# Patient Record
Sex: Female | Born: 1960 | Race: White | Hispanic: No | Marital: Married | Smoking: Never smoker
Health system: Southern US, Community
[De-identification: ages and names within clinical notes are randomized; demographics above are authoritative.]

## PROBLEM LIST (undated history)

## (undated) DIAGNOSIS — K219 Gastro-esophageal reflux disease without esophagitis: Secondary | ICD-10-CM

## (undated) DIAGNOSIS — E079 Disorder of thyroid, unspecified: Secondary | ICD-10-CM

## (undated) HISTORY — DX: Disorder of thyroid, unspecified: E07.9

## (undated) HISTORY — DX: Gastro-esophageal reflux disease without esophagitis: K21.9

## (undated) HISTORY — PX: TONSILLECTOMY: SUR1361

---

## 2007-02-26 ENCOUNTER — Other Ambulatory Visit: Admission: RE | Admit: 2007-02-26 | Discharge: 2007-02-26 | Payer: Self-pay | Admitting: Obstetrics and Gynecology

## 2007-06-22 ENCOUNTER — Encounter: Admission: RE | Admit: 2007-06-22 | Discharge: 2007-06-22 | Payer: Self-pay | Admitting: Obstetrics and Gynecology

## 2007-12-01 ENCOUNTER — Encounter: Admission: RE | Admit: 2007-12-01 | Discharge: 2007-12-01 | Payer: Self-pay | Admitting: Obstetrics and Gynecology

## 2009-02-13 ENCOUNTER — Encounter: Admission: RE | Admit: 2009-02-13 | Discharge: 2009-02-13 | Payer: Self-pay | Admitting: Otolaryngology

## 2009-03-10 ENCOUNTER — Other Ambulatory Visit: Admission: RE | Admit: 2009-03-10 | Discharge: 2009-03-10 | Payer: Self-pay | Admitting: Obstetrics and Gynecology

## 2010-08-19 ENCOUNTER — Encounter: Payer: Self-pay | Admitting: Endocrinology

## 2010-08-19 ENCOUNTER — Encounter: Payer: Self-pay | Admitting: Obstetrics and Gynecology

## 2011-07-17 ENCOUNTER — Other Ambulatory Visit: Payer: Self-pay | Admitting: Family Medicine

## 2011-07-17 DIAGNOSIS — Z1231 Encounter for screening mammogram for malignant neoplasm of breast: Secondary | ICD-10-CM

## 2011-08-21 ENCOUNTER — Ambulatory Visit
Admission: RE | Admit: 2011-08-21 | Discharge: 2011-08-21 | Disposition: A | Discharge status: Home / Self Care | Payer: BC Managed Care – PPO | Source: Ambulatory Visit | Attending: Family Medicine | Admitting: Family Medicine

## 2011-08-21 DIAGNOSIS — Z1231 Encounter for screening mammogram for malignant neoplasm of breast: Secondary | ICD-10-CM

## 2012-07-20 ENCOUNTER — Other Ambulatory Visit: Payer: Self-pay | Admitting: Otolaryngology

## 2012-07-20 DIAGNOSIS — E049 Nontoxic goiter, unspecified: Secondary | ICD-10-CM

## 2013-11-10 ENCOUNTER — Other Ambulatory Visit: Payer: Self-pay

## 2013-11-10 DIAGNOSIS — Z1231 Encounter for screening mammogram for malignant neoplasm of breast: Secondary | ICD-10-CM

## 2013-11-29 ENCOUNTER — Ambulatory Visit: Payer: BC Managed Care – PPO

## 2013-11-30 ENCOUNTER — Ambulatory Visit: Payer: BC Managed Care – PPO

## 2014-04-11 ENCOUNTER — Ambulatory Visit: Payer: BC Managed Care – PPO

## 2014-04-13 ENCOUNTER — Ambulatory Visit
Admission: RE | Admit: 2014-04-13 | Discharge: 2014-04-13 | Disposition: A | Discharge status: Home / Self Care | Payer: BC Managed Care – PPO | Source: Ambulatory Visit

## 2014-04-13 DIAGNOSIS — Z1231 Encounter for screening mammogram for malignant neoplasm of breast: Secondary | ICD-10-CM

## 2016-03-18 DIAGNOSIS — Z1211 Encounter for screening for malignant neoplasm of colon: Secondary | ICD-10-CM | POA: Diagnosis not present

## 2016-08-07 DIAGNOSIS — Z6826 Body mass index (BMI) 26.0-26.9, adult: Secondary | ICD-10-CM | POA: Diagnosis not present

## 2016-08-07 DIAGNOSIS — Z01419 Encounter for gynecological examination (general) (routine) without abnormal findings: Secondary | ICD-10-CM | POA: Diagnosis not present

## 2016-12-02 DIAGNOSIS — E039 Hypothyroidism, unspecified: Secondary | ICD-10-CM | POA: Diagnosis not present

## 2016-12-02 DIAGNOSIS — S80861A Insect bite (nonvenomous), right lower leg, initial encounter: Secondary | ICD-10-CM | POA: Diagnosis not present

## 2016-12-02 DIAGNOSIS — E7800 Pure hypercholesterolemia, unspecified: Secondary | ICD-10-CM | POA: Diagnosis not present

## 2016-12-02 DIAGNOSIS — Z0000 Encounter for general adult medical examination without abnormal findings: Secondary | ICD-10-CM | POA: Diagnosis not present

## 2016-12-02 DIAGNOSIS — E559 Vitamin D deficiency, unspecified: Secondary | ICD-10-CM | POA: Diagnosis not present

## 2016-12-02 DIAGNOSIS — W57XXXA Bitten or stung by nonvenomous insect and other nonvenomous arthropods, initial encounter: Secondary | ICD-10-CM | POA: Diagnosis not present

## 2016-12-04 ENCOUNTER — Other Ambulatory Visit: Payer: Self-pay | Admitting: Family Medicine

## 2016-12-04 DIAGNOSIS — Z1231 Encounter for screening mammogram for malignant neoplasm of breast: Secondary | ICD-10-CM

## 2016-12-24 ENCOUNTER — Ambulatory Visit
Admission: RE | Admit: 2016-12-24 | Discharge: 2016-12-24 | Disposition: A | Discharge status: Home / Self Care | Payer: Self-pay | Source: Ambulatory Visit | Attending: Family Medicine | Admitting: Family Medicine

## 2016-12-24 DIAGNOSIS — Z1231 Encounter for screening mammogram for malignant neoplasm of breast: Secondary | ICD-10-CM

## 2017-10-14 DIAGNOSIS — D225 Melanocytic nevi of trunk: Secondary | ICD-10-CM | POA: Diagnosis not present

## 2017-10-14 DIAGNOSIS — D2261 Melanocytic nevi of right upper limb, including shoulder: Secondary | ICD-10-CM | POA: Diagnosis not present

## 2017-10-14 DIAGNOSIS — D2239 Melanocytic nevi of other parts of face: Secondary | ICD-10-CM | POA: Diagnosis not present

## 2017-10-14 DIAGNOSIS — D2262 Melanocytic nevi of left upper limb, including shoulder: Secondary | ICD-10-CM | POA: Diagnosis not present

## 2018-01-14 DIAGNOSIS — E559 Vitamin D deficiency, unspecified: Secondary | ICD-10-CM | POA: Diagnosis not present

## 2018-01-14 DIAGNOSIS — E039 Hypothyroidism, unspecified: Secondary | ICD-10-CM | POA: Diagnosis not present

## 2018-01-14 DIAGNOSIS — Z0000 Encounter for general adult medical examination without abnormal findings: Secondary | ICD-10-CM | POA: Diagnosis not present

## 2018-01-14 DIAGNOSIS — E7800 Pure hypercholesterolemia, unspecified: Secondary | ICD-10-CM | POA: Diagnosis not present

## 2019-03-28 IMAGING — MG DIGITAL SCREENING BILATERAL MAMMOGRAM WITH CAD
4 series · 4 of 4 positions shown · non-contrast
Comparison: Previous exam(s).

CLINICAL DATA: Screening.

EXAM:
DIGITAL SCREENING BILATERAL MAMMOGRAM WITH CAD

[L MLO]
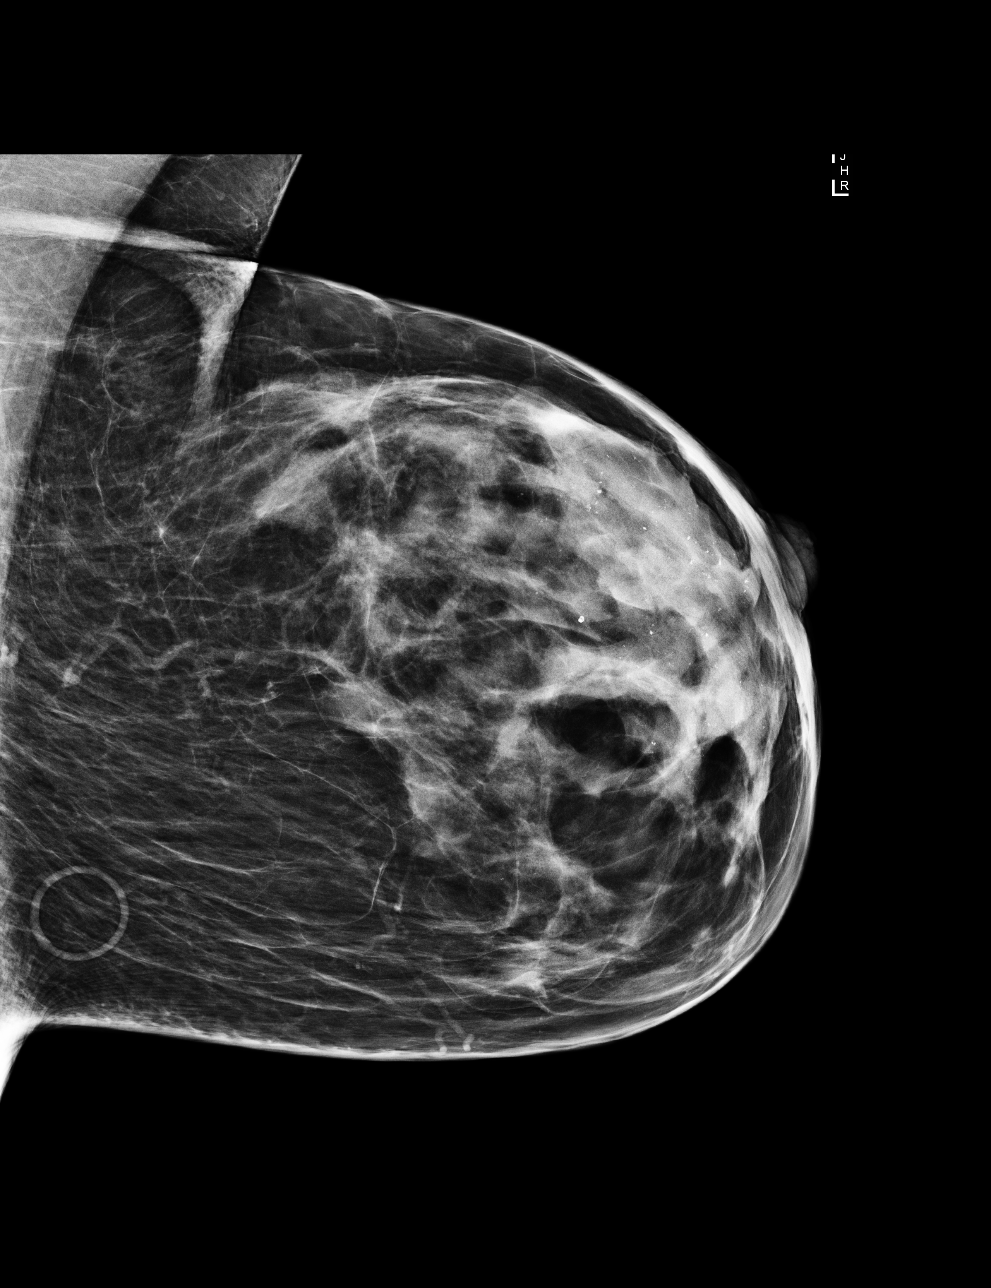

[R CC]
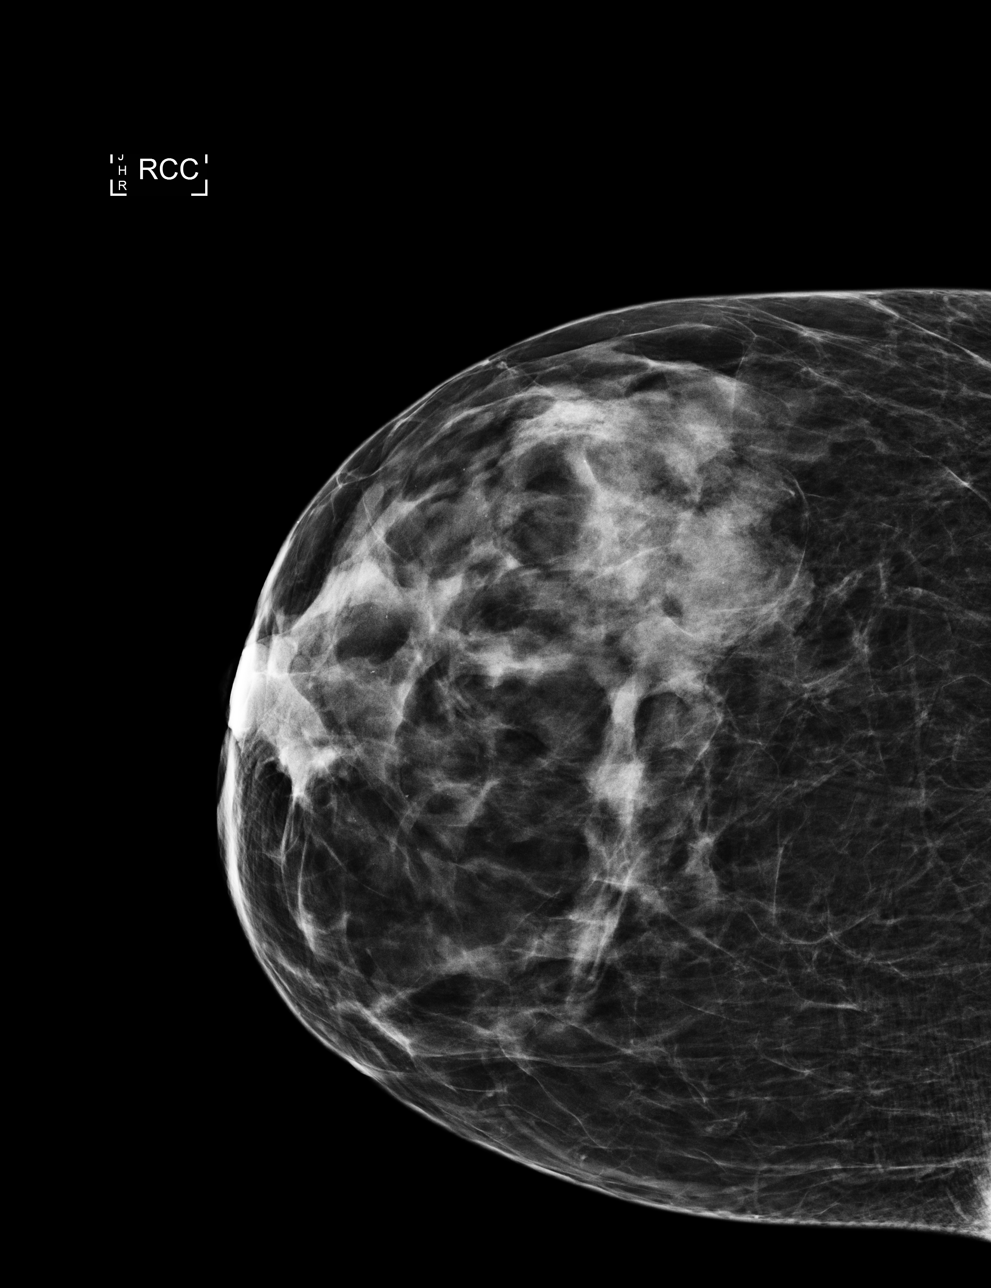

[R MLO]
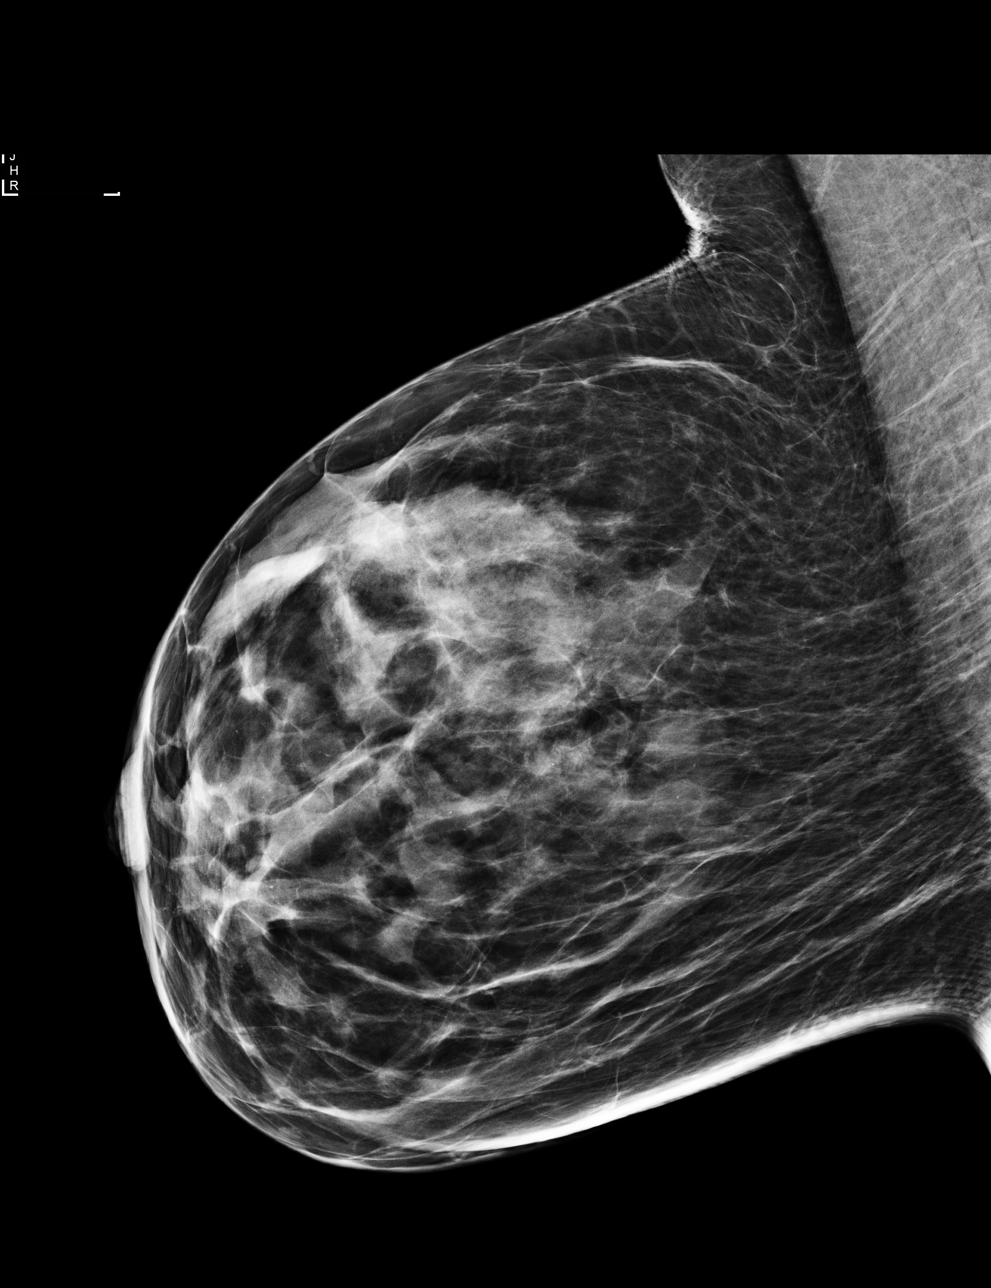

[L CC]
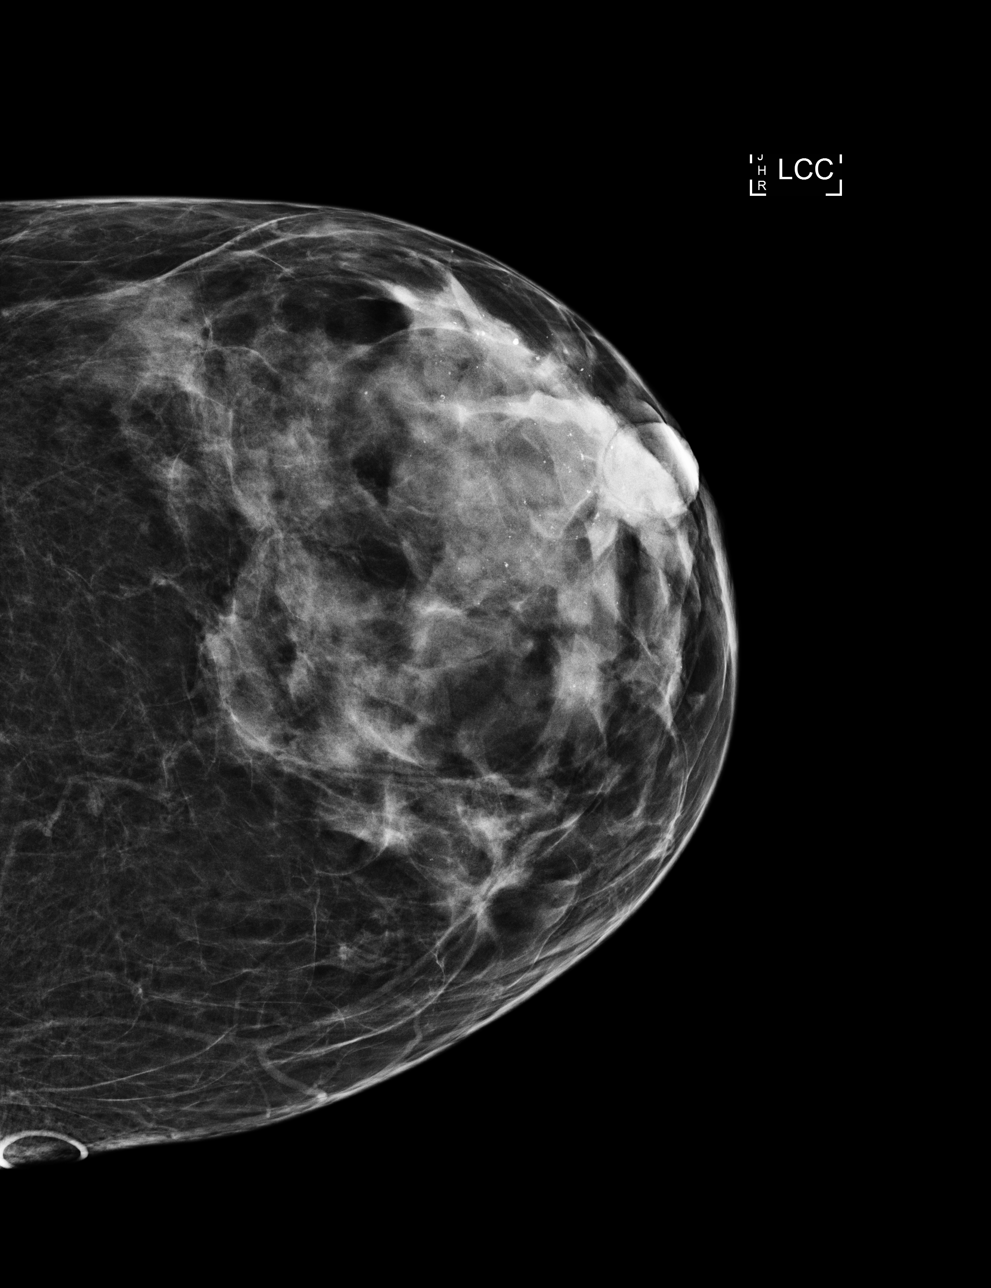

[4 of 4 positions shown; findings below may reference images not displayed]

ACR Breast Density Category c: The breast tissue is heterogeneously
dense, which may obscure small masses.
FINDINGS: There are no findings suspicious for malignancy. Images were
processed with CAD.
IMPRESSION: No mammographic evidence of malignancy. A result letter of this
screening mammogram will be mailed directly to the patient.

RECOMMENDATION:
Screening mammogram in one year. (Code:[id number])

BI-RADS CATEGORY  1: Negative.

## 2019-05-04 DIAGNOSIS — E039 Hypothyroidism, unspecified: Secondary | ICD-10-CM | POA: Diagnosis not present

## 2019-05-04 DIAGNOSIS — E7800 Pure hypercholesterolemia, unspecified: Secondary | ICD-10-CM | POA: Diagnosis not present

## 2019-05-04 DIAGNOSIS — E559 Vitamin D deficiency, unspecified: Secondary | ICD-10-CM | POA: Diagnosis not present

## 2019-05-04 DIAGNOSIS — Z23 Encounter for immunization: Secondary | ICD-10-CM | POA: Diagnosis not present

## 2019-05-04 DIAGNOSIS — Z0000 Encounter for general adult medical examination without abnormal findings: Secondary | ICD-10-CM | POA: Diagnosis not present

## 2019-10-18 ENCOUNTER — Ambulatory Visit: Payer: BC Managed Care – PPO | Attending: Internal Medicine

## 2019-10-18 ENCOUNTER — Ambulatory Visit: Payer: BLUE CROSS/BLUE SHIELD

## 2019-10-18 DIAGNOSIS — Z23 Encounter for immunization: Secondary | ICD-10-CM

## 2019-10-18 NOTE — Progress Notes (Signed)
   Covid-19 Vaccination Clinic  Name:  ALESA ECHEVARRIA    MRN: [id number] DOB: 1960/10/29  10/18/2019  Ms. Sivils was observed post Covid-19 immunization for 15 minutes without incident. She was provided with Vaccine Information Sheet and instruction to access the V-Safe system.   Ms. Fanara was instructed to call 911 with any severe reactions post vaccine: Marland Kitchen Difficulty breathing  . Swelling of face and throat  . A fast heartbeat  . A bad rash all over body  . Dizziness and weakness   Immunizations Administered    Name Date Dose VIS Date Route   Pfizer COVID-19 Vaccine 10/18/2019 11:20 AM 0.3 mL 07/09/2019 Intramuscular   Manufacturer: [employer], [employer]   Lot: [id number]   NDC: [address]-8

## 2019-11-10 ENCOUNTER — Ambulatory Visit: Payer: BC Managed Care – PPO

## 2019-12-07 ENCOUNTER — Other Ambulatory Visit: Payer: Self-pay | Admitting: Family Medicine

## 2019-12-07 DIAGNOSIS — Z1231 Encounter for screening mammogram for malignant neoplasm of breast: Secondary | ICD-10-CM

## 2019-12-25 DIAGNOSIS — R079 Chest pain, unspecified: Secondary | ICD-10-CM | POA: Diagnosis not present

## 2020-01-07 DIAGNOSIS — M353 Polymyalgia rheumatica: Secondary | ICD-10-CM | POA: Diagnosis not present

## 2020-01-07 DIAGNOSIS — M25512 Pain in left shoulder: Secondary | ICD-10-CM | POA: Diagnosis not present

## 2020-01-07 DIAGNOSIS — S40261D Insect bite (nonvenomous) of right shoulder, subsequent encounter: Secondary | ICD-10-CM | POA: Diagnosis not present

## 2020-01-12 DIAGNOSIS — M25512 Pain in left shoulder: Secondary | ICD-10-CM | POA: Diagnosis not present

## 2020-01-12 DIAGNOSIS — M940 Chondrocostal junction syndrome [Tietze]: Secondary | ICD-10-CM | POA: Diagnosis not present

## 2020-01-14 DIAGNOSIS — M791 Myalgia, unspecified site: Secondary | ICD-10-CM | POA: Diagnosis not present

## 2020-01-14 DIAGNOSIS — S20461A Insect bite (nonvenomous) of right back wall of thorax, initial encounter: Secondary | ICD-10-CM | POA: Diagnosis not present

## 2020-01-14 DIAGNOSIS — L03119 Cellulitis of unspecified part of limb: Secondary | ICD-10-CM | POA: Diagnosis not present

## 2020-01-14 DIAGNOSIS — W57XXXA Bitten or stung by nonvenomous insect and other nonvenomous arthropods, initial encounter: Secondary | ICD-10-CM | POA: Diagnosis not present

## 2020-01-20 DIAGNOSIS — D485 Neoplasm of uncertain behavior of skin: Secondary | ICD-10-CM | POA: Diagnosis not present

## 2020-01-20 DIAGNOSIS — B429 Sporotrichosis, unspecified: Secondary | ICD-10-CM | POA: Diagnosis not present

## 2020-01-20 DIAGNOSIS — C44629 Squamous cell carcinoma of skin of left upper limb, including shoulder: Secondary | ICD-10-CM | POA: Diagnosis not present

## 2020-02-01 ENCOUNTER — Ambulatory Visit: Payer: BC Managed Care – PPO

## 2020-02-07 DIAGNOSIS — C44629 Squamous cell carcinoma of skin of left upper limb, including shoulder: Secondary | ICD-10-CM | POA: Diagnosis not present

## 2020-02-22 DIAGNOSIS — E039 Hypothyroidism, unspecified: Secondary | ICD-10-CM | POA: Diagnosis not present

## 2020-02-22 DIAGNOSIS — Z0000 Encounter for general adult medical examination without abnormal findings: Secondary | ICD-10-CM | POA: Diagnosis not present

## 2020-02-22 DIAGNOSIS — E559 Vitamin D deficiency, unspecified: Secondary | ICD-10-CM | POA: Diagnosis not present

## 2020-02-22 DIAGNOSIS — Z13220 Encounter for screening for lipoid disorders: Secondary | ICD-10-CM | POA: Diagnosis not present

## 2020-04-10 DIAGNOSIS — D2271 Melanocytic nevi of right lower limb, including hip: Secondary | ICD-10-CM | POA: Diagnosis not present

## 2020-04-10 DIAGNOSIS — D2272 Melanocytic nevi of left lower limb, including hip: Secondary | ICD-10-CM | POA: Diagnosis not present

## 2020-04-10 DIAGNOSIS — Z85828 Personal history of other malignant neoplasm of skin: Secondary | ICD-10-CM | POA: Diagnosis not present

## 2020-04-10 DIAGNOSIS — D1801 Hemangioma of skin and subcutaneous tissue: Secondary | ICD-10-CM | POA: Diagnosis not present

## 2020-04-11 DIAGNOSIS — Z01419 Encounter for gynecological examination (general) (routine) without abnormal findings: Secondary | ICD-10-CM | POA: Diagnosis not present

## 2020-04-19 DIAGNOSIS — Z1212 Encounter for screening for malignant neoplasm of rectum: Secondary | ICD-10-CM | POA: Diagnosis not present

## 2020-04-19 DIAGNOSIS — Z1211 Encounter for screening for malignant neoplasm of colon: Secondary | ICD-10-CM | POA: Diagnosis not present

## 2020-04-22 LAB — EXTERNAL GENERIC LAB PROCEDURE: COLOGUARD: NEGATIVE

## 2020-05-01 DIAGNOSIS — D225 Melanocytic nevi of trunk: Secondary | ICD-10-CM | POA: Diagnosis not present

## 2020-05-01 DIAGNOSIS — L821 Other seborrheic keratosis: Secondary | ICD-10-CM | POA: Diagnosis not present

## 2020-05-01 DIAGNOSIS — Z85828 Personal history of other malignant neoplasm of skin: Secondary | ICD-10-CM | POA: Diagnosis not present

## 2021-02-19 DIAGNOSIS — E559 Vitamin D deficiency, unspecified: Secondary | ICD-10-CM | POA: Diagnosis not present

## 2021-02-19 DIAGNOSIS — Z0000 Encounter for general adult medical examination without abnormal findings: Secondary | ICD-10-CM | POA: Diagnosis not present

## 2021-02-19 DIAGNOSIS — E039 Hypothyroidism, unspecified: Secondary | ICD-10-CM | POA: Diagnosis not present

## 2021-02-21 DIAGNOSIS — E039 Hypothyroidism, unspecified: Secondary | ICD-10-CM | POA: Diagnosis not present

## 2021-02-21 DIAGNOSIS — Z0000 Encounter for general adult medical examination without abnormal findings: Secondary | ICD-10-CM | POA: Diagnosis not present

## 2021-02-21 DIAGNOSIS — E559 Vitamin D deficiency, unspecified: Secondary | ICD-10-CM | POA: Diagnosis not present

## 2021-02-26 DIAGNOSIS — E039 Hypothyroidism, unspecified: Secondary | ICD-10-CM | POA: Diagnosis not present

## 2021-02-26 DIAGNOSIS — L700 Acne vulgaris: Secondary | ICD-10-CM | POA: Diagnosis not present

## 2021-02-26 DIAGNOSIS — E559 Vitamin D deficiency, unspecified: Secondary | ICD-10-CM | POA: Diagnosis not present

## 2021-02-26 DIAGNOSIS — Z0000 Encounter for general adult medical examination without abnormal findings: Secondary | ICD-10-CM | POA: Diagnosis not present

## 2021-03-26 ENCOUNTER — Other Ambulatory Visit: Payer: Self-pay | Admitting: Internal Medicine

## 2021-03-26 DIAGNOSIS — Z1231 Encounter for screening mammogram for malignant neoplasm of breast: Secondary | ICD-10-CM

## 2021-04-03 DIAGNOSIS — Z1231 Encounter for screening mammogram for malignant neoplasm of breast: Secondary | ICD-10-CM | POA: Diagnosis not present

## 2021-04-09 DIAGNOSIS — D2261 Melanocytic nevi of right upper limb, including shoulder: Secondary | ICD-10-CM | POA: Diagnosis not present

## 2021-04-09 DIAGNOSIS — D3611 Benign neoplasm of peripheral nerves and autonomic nervous system of face, head, and neck: Secondary | ICD-10-CM | POA: Diagnosis not present

## 2021-04-09 DIAGNOSIS — Z85828 Personal history of other malignant neoplasm of skin: Secondary | ICD-10-CM | POA: Diagnosis not present

## 2021-04-09 DIAGNOSIS — D234 Other benign neoplasm of skin of scalp and neck: Secondary | ICD-10-CM | POA: Diagnosis not present

## 2021-04-09 DIAGNOSIS — D2262 Melanocytic nevi of left upper limb, including shoulder: Secondary | ICD-10-CM | POA: Diagnosis not present

## 2021-04-09 DIAGNOSIS — B078 Other viral warts: Secondary | ICD-10-CM | POA: Diagnosis not present

## 2021-04-09 DIAGNOSIS — D225 Melanocytic nevi of trunk: Secondary | ICD-10-CM | POA: Diagnosis not present

## 2021-05-07 DIAGNOSIS — H5203 Hypermetropia, bilateral: Secondary | ICD-10-CM | POA: Diagnosis not present

## 2021-05-07 DIAGNOSIS — H10413 Chronic giant papillary conjunctivitis, bilateral: Secondary | ICD-10-CM | POA: Diagnosis not present

## 2021-05-07 DIAGNOSIS — H524 Presbyopia: Secondary | ICD-10-CM | POA: Diagnosis not present

## 2021-05-07 DIAGNOSIS — H43811 Vitreous degeneration, right eye: Secondary | ICD-10-CM | POA: Diagnosis not present

## 2022-01-07 DIAGNOSIS — J0140 Acute pansinusitis, unspecified: Secondary | ICD-10-CM | POA: Diagnosis not present

## 2022-01-07 DIAGNOSIS — H10021 Other mucopurulent conjunctivitis, right eye: Secondary | ICD-10-CM | POA: Diagnosis not present

## 2022-03-19 DIAGNOSIS — Z0000 Encounter for general adult medical examination without abnormal findings: Secondary | ICD-10-CM | POA: Diagnosis not present

## 2022-03-26 DIAGNOSIS — Z0000 Encounter for general adult medical examination without abnormal findings: Secondary | ICD-10-CM | POA: Diagnosis not present

## 2022-03-26 DIAGNOSIS — E7800 Pure hypercholesterolemia, unspecified: Secondary | ICD-10-CM | POA: Diagnosis not present

## 2022-03-26 DIAGNOSIS — J3489 Other specified disorders of nose and nasal sinuses: Secondary | ICD-10-CM | POA: Diagnosis not present

## 2023-03-11 DIAGNOSIS — L237 Allergic contact dermatitis due to plants, except food: Secondary | ICD-10-CM | POA: Diagnosis not present

## 2023-03-13 DIAGNOSIS — L237 Allergic contact dermatitis due to plants, except food: Secondary | ICD-10-CM | POA: Diagnosis not present

## 2023-03-19 DIAGNOSIS — L237 Allergic contact dermatitis due to plants, except food: Secondary | ICD-10-CM | POA: Diagnosis not present

## 2023-05-28 DIAGNOSIS — Z0000 Encounter for general adult medical examination without abnormal findings: Secondary | ICD-10-CM | POA: Diagnosis not present

## 2023-05-28 DIAGNOSIS — E559 Vitamin D deficiency, unspecified: Secondary | ICD-10-CM | POA: Diagnosis not present

## 2023-05-28 DIAGNOSIS — E039 Hypothyroidism, unspecified: Secondary | ICD-10-CM | POA: Diagnosis not present

## 2023-05-28 DIAGNOSIS — E7800 Pure hypercholesterolemia, unspecified: Secondary | ICD-10-CM | POA: Diagnosis not present

## 2023-08-13 ENCOUNTER — Ambulatory Visit: Payer: BC Managed Care – PPO | Admitting: Family Medicine

## 2023-08-13 DIAGNOSIS — E079 Disorder of thyroid, unspecified: Secondary | ICD-10-CM | POA: Insufficient documentation

## 2023-08-14 ENCOUNTER — Telehealth: Payer: Self-pay | Admitting: Urgent Care

## 2023-08-14 ENCOUNTER — Encounter: Payer: Self-pay | Admitting: Urgent Care

## 2023-08-14 ENCOUNTER — Ambulatory Visit: Payer: No Typology Code available for payment source | Admitting: Urgent Care

## 2023-08-14 VITALS — BP 113/78 | HR 75 | Temp 98.0°F | Ht 68.0 in | Wt 169.8 lb

## 2023-08-14 DIAGNOSIS — R142 Eructation: Secondary | ICD-10-CM

## 2023-08-14 DIAGNOSIS — K219 Gastro-esophageal reflux disease without esophagitis: Secondary | ICD-10-CM | POA: Diagnosis not present

## 2023-08-14 NOTE — Patient Instructions (Signed)
Consider taking over the counter famotidine (for heartburn symptoms) in place of the PPI (nexium, omeprazole)  You can also purchase OTC Florastor which is a probiotic to help.   Return AFTER 08/25/23 if your symptoms persist and we will perform the H pylori breath test. If you perform this test, you must be FASTING for 8+ hours prior, no PPI therapy for 2 weeks prior.  If negative, consider SIBO testing.

## 2023-08-14 NOTE — Telephone Encounter (Signed)
Copied from CRM [id number]. Topic: Clinical - Medication Question >> Aug 14, 2023 11:52 AM Meghan Durham wrote: Reason for CRM: Patient seen today, wants to confirm what was recommended taking - Famotidine 20mg  or 40mg .

## 2023-08-14 NOTE — Progress Notes (Signed)
New Patient Office Visit  Subjective:  Patient ID: Meghan Durham, female    DOB: May 04, 1961  Age: 63 y.o. MRN: [id number]  CC:  Chief Complaint  Patient presents with   Gastroesophageal Reflux    Has had indigestion since she was on several antibiotics which she started in 06/2023   Discussed the use of AI software for clinical note transcription with the patient who gave verbal consent to proceed.   HPI Meghan Durham presents to establish care. Has an acute concern today, completed annual PE in Oct 2024 with previous PCP. She states mammogram and Cologuard were ordered by previous PCP and will complete soon.  History of Present Illness The patient, with a history of good health, presented with a chief complaint of oral discomfort and heartburn-like symptoms. The onset of symptoms was approximately six months ago, following a dental implant and crown placement. The patient reported a persistent tenderness on the roof of the mouth, initially suspected to be related to the dental procedure. However, dental evaluations revealed no abnormalities.  In December, the symptoms worsened, leading the patient to self-medicate with clindamycin, suspecting an infection. The dentist suggested a possible sinus infection and prescribed a Z-Pak. Despite these treatments, the discomfort persisted, and the patient took another course of clindamycin later in the month.  The patient also reported a sore throat and frequent burping, which led to a suspicion of acid reflux. In an attempt to address these symptoms, the patient took a fungal pill and mouthwash for suspected thrush in January. However, the mouthwash was discontinued after two days due to discomfort.  A significant revelation occurred when the patient identified a potential allergic reaction to Sodium Lauryl Sulfate (SLS) in her toothpaste, which seemed to be exacerbating the oral discomfort. Upon discontinuing the use of this toothpaste, the  patient reported significant improvement in symptoms.  Despite this improvement, the patient still reported some residual throat irritation and occasional heartburn-like symptoms, particularly after eating. The patient also experienced a period of early satiety and bloating, which led to a decrease in appetite and food intake. However, these symptoms have been improving recently.  The patient has been managing these symptoms with over-the-counter medications, including Advil and a proton pump inhibitor (PPI), which were discontinued due to perceived worsening of symptoms. The patient has been eating a bland diet to minimize discomfort.  The patient also reported a period of significant stress due to family health issues, which may have contributed to the exacerbation of symptoms. The patient denied any changes in bowel habits or severe persistent diarrhea.  In summary, the patient presented with a complex symptomatology of oral discomfort, heartburn-like symptoms, and gastrointestinal disturbances following a dental procedure and suspected allergic reaction to toothpaste. The patient's self-initiated changes in oral hygiene products and dietary modifications have led to some improvement in symptoms. However, residual symptoms persist, warranting further investigation.   Outpatient Encounter Medications as of 08/14/2023  Medication Sig   betamethasone dipropionate 0.05 % cream    levothyroxine (SYNTHROID) 100 MCG tablet TAKE 1 TABLET BY MOUTH EVERY DAY ON EMPTY STOMACH IN THE MORNING   Vitamin D, Ergocalciferol, (DRISDOL) 1.25 MG (50000 UNIT) CAPS capsule Take by mouth.   fexofenadine (ALLEGRA) 180 MG tablet Take by mouth. (Patient not taking: Reported on 08/14/2023)   No facility-administered encounter medications on file as of 08/14/2023.    No past medical history on file.  No past surgical history on file.  No family history on file.  Social  History   Socioeconomic History   Marital  status: Married    Spouse name: Not on file   Number of children: Not on file   Years of education: Not on file   Highest education level: Not on file  Occupational History   Not on file  Tobacco Use   Smoking status: Never   Smokeless tobacco: Never  Substance and Sexual Activity   Alcohol use: Not on file   Drug use: Not on file   Sexual activity: Not on file  Other Topics Concern   Not on file  Social History Narrative   Not on file   Social Drivers of Health   Financial Resource Strain: Not on file  Food Insecurity: Not on file  Transportation Needs: Not on file  Physical Activity: Not on file  Stress: Not on file  Social Connections: Not on file  Intimate Partner Violence: Not on file    ROS: as noted in HPI  Objective:  BP 113/78   Pulse 75   Temp 98 F (36.7 C)   Ht 5\' 8"  (1.727 m)   Wt 169 lb 12.8 oz (77 kg)   LMP 08/13/2011   SpO2 100%   BMI 25.82 kg/m   Physical Exam Vitals and nursing note reviewed.  Constitutional:      General: She is not in acute distress.    Appearance: Normal appearance. She is normal weight. She is not ill-appearing, toxic-appearing or diaphoretic.  HENT:     Head: Normocephalic and atraumatic.     Mouth/Throat:     Mouth: Mucous membranes are moist.     Pharynx: Oropharynx is clear. No oropharyngeal exudate or posterior oropharyngeal erythema.  Eyes:     General: No scleral icterus.       Right eye: No discharge.        Left eye: No discharge.     Extraocular Movements: Extraocular movements intact.     Pupils: Pupils are equal, round, and reactive to light.  Cardiovascular:     Rate and Rhythm: Normal rate and regular rhythm.     Pulses: Normal pulses.     Heart sounds: No murmur heard. Pulmonary:     Effort: Pulmonary effort is normal. No respiratory distress.     Breath sounds: Normal breath sounds. No stridor. No wheezing, rhonchi or rales.  Chest:     Chest wall: No tenderness.  Abdominal:     General:  Abdomen is flat. Bowel sounds are normal. There is no distension.     Palpations: Abdomen is soft. There is no mass.     Tenderness: There is no abdominal tenderness. There is no right CVA tenderness, left CVA tenderness, guarding or rebound.     Hernia: No hernia is present.     Comments: Negative hepatosplenomegaly Percussion normal throughout  Skin:    General: Skin is warm and dry.     Coloration: Skin is not jaundiced.     Findings: No bruising, erythema or rash.  Neurological:     General: No focal deficit present.     Mental Status: She is alert and oriented to person, place, and time.     Cranial Nerves: No cranial nerve deficit.     Motor: No weakness.     Gait: Gait normal.     Last CBC No results found for: "WBC", "HGB", "HCT", "MCV", "MCH", "RDW", "PLT" Last metabolic panel No results found for: "GLUCOSE", "NA", "K", "CL", "CO2", "BUN", "CREATININE", "EGFR", "CALCIUM", "PHOS", "PROT", "  ALBUMIN", "LABGLOB", "AGRATIO", "BILITOT", "ALKPHOS", "AST", "ALT", "ANIONGAP"    Assessment & Plan:  Gastroesophageal reflux disease, unspecified whether esophagitis present -     H. pylori breath test; Future  Eructation -     H. pylori breath test; Future  Assessment and Plan Gastroesophageal Reflux Disease (GERD) Recent onset of symptoms including heartburn, burping, and throat irritation. Symptoms improved but not resolved with short course of PPI. History of recent antibiotic use (Clindamycin and Z-Pak) and NSAID use (Advil) which may have contributed to symptoms. No dysphagia or odynophagia. No signs of esophagitis on examination. -Consider over-the-counter Famotidine as needed for persistent symptoms. -Continue Florastor probiotic for gut health. -Avoid gas-producing foods and ensure adequate chewing and hydration with meals to reduce air swallowing. -If symptoms persist, perform H. pylori breath test after 08/25/2023 (must be off PPI therapy for 2 weeks prior to  testing).  Dental Health Recent dental implant and crown placement with subsequent tenderness in the roof of the mouth. Symptoms improved with change in toothpaste. -Continue current dental hygiene practices.  General Health Maintenance -Consider SIBO testing if H. pylori test is negative and symptoms persist.  Return in about 10 months (around 06/13/2024) for Annual Physical.   Maretta Bees, PA

## 2023-08-14 NOTE — Telephone Encounter (Signed)
Spoke with patient regarding results/recommendations.  

## 2023-08-15 ENCOUNTER — Encounter: Payer: Self-pay | Admitting: Urgent Care

## 2023-08-15 ENCOUNTER — Other Ambulatory Visit: Payer: Self-pay | Admitting: Urgent Care

## 2023-08-15 MED ORDER — SUCRALFATE 1 G PO TABS: 1.0000 g | ORAL_TABLET | Freq: Three times a day (TID) | ORAL | 0 refills | Status: DC

## 2023-08-15 NOTE — Telephone Encounter (Signed)
Spoke with Pt and she prefers to have a referral for to GI in hopes that they can see her sooner and she does want to start the carafate

## 2023-08-15 NOTE — Telephone Encounter (Signed)
Please call patient back and notify her we could perform the stool h pylori if she wants a workup sooner (not as reliable but better than nothing. If positive, we would treat, if negative we would still perform the breath test). I could also add carafate for her to take for the next 8 days... please advise

## 2023-08-15 NOTE — Telephone Encounter (Signed)
Copied from CRM [id number]. Topic: Clinical - Medical Advice >> Aug 15, 2023  8:29 AM Adele Barthel wrote: Reason for CRM: Patient is still having severe gastrointestinal symptoms for 2 weeks. Not currently on PPI due to future H. Pylori testing. Would like provider to possibly have her seen sooner with GI due to the severity of her symptoms. She currently has an appointment 05/11, but wants to be seen sooner if possible. She currently has lost 8 pounds in 2.5 weeks, and has no appetite. She has increased her dose of Pepcid to 20 mg twice day and would like to confirm if that is ok. Would also like recommendations for over-the-counter treatments in the meantime, to give her relief. Would like to know if Tylenol is ok as well. CB# [phone] [id number]

## 2023-08-15 NOTE — Telephone Encounter (Signed)
Sheena, I sent this note to the patient, but she apparently does not have mychart so not sure how she will receive it. Do you mind calling her with the info? Thanks  Paxtyn-   I have placed a referral to GI. The location that gets patients in the fastest seems to be the [facility] in [place].    Their address and phone is listed below: [address] [place], [place] [address] Hours: Open ? Closes 4:30?PM Phone: [phone]     Additionally, I have called in the carafate for you. I hope you start feeling better soon and get some answers. Let me know how else we can help.   Respectfully, Guy Sandifer

## 2023-08-18 ENCOUNTER — Encounter: Payer: Self-pay | Admitting: Gastroenterology

## 2023-08-25 ENCOUNTER — Ambulatory Visit: Payer: No Typology Code available for payment source | Admitting: Family Medicine

## 2023-08-27 ENCOUNTER — Telehealth: Payer: Self-pay

## 2023-08-27 NOTE — Telephone Encounter (Signed)
Pt advised.

## 2023-08-27 NOTE — Telephone Encounter (Signed)
The solution used in the Helicobacter pylori (H. pylori) breath test is a mixture of 13C-urea and citric acid powder dissolved in water. It is not radioactive

## 2023-08-27 NOTE — Telephone Encounter (Signed)
I checked with Meghan Durham and she said she could call and check. Please advise     Medical Advice >> Aug 27, 2023  8:11 AM Meghan Durham wrote: Reason for CRM: patient would like to know if the H pylori test tat she is going to have on 09/01/2023,is going to be radioactive or non radioactive. She is requesting a call with this  this information

## 2023-09-01 ENCOUNTER — Encounter: Payer: Self-pay | Admitting: Urgent Care

## 2023-09-01 ENCOUNTER — Ambulatory Visit: Payer: No Typology Code available for payment source | Admitting: Urgent Care

## 2023-09-01 VITALS — BP 113/78 | HR 76 | Wt 171.0 lb

## 2023-09-01 DIAGNOSIS — R1013 Epigastric pain: Secondary | ICD-10-CM | POA: Diagnosis not present

## 2023-09-01 DIAGNOSIS — R5383 Other fatigue: Secondary | ICD-10-CM

## 2023-09-01 DIAGNOSIS — E039 Hypothyroidism, unspecified: Secondary | ICD-10-CM

## 2023-09-01 DIAGNOSIS — R432 Parageusia: Secondary | ICD-10-CM | POA: Diagnosis not present

## 2023-09-01 DIAGNOSIS — K219 Gastro-esophageal reflux disease without esophagitis: Secondary | ICD-10-CM | POA: Diagnosis not present

## 2023-09-01 LAB — CBC
HCT: 45.9 % (ref 36.0–46.0)
Hemoglobin: 15 g/dL (ref 12.0–15.0)
MCHC: 32.7 g/dL (ref 30.0–36.0)
MCV: 91.5 fL (ref 78.0–100.0)
Platelets: 285 10*3/uL (ref 150.0–400.0)
RBC: 5.02 Mil/uL (ref 3.87–5.11)
RDW: 13.3 % (ref 11.5–15.5)
WBC: 7.5 10*3/uL (ref 4.0–10.5)

## 2023-09-01 LAB — B12 AND FOLATE PANEL
Folate: >25.2 ng/mL (ref >5.9)
Vitamin B-12: 336 pg/mL (ref 211–911)

## 2023-09-01 MED ORDER — PANTOPRAZOLE SODIUM 40 MG PO TBEC: 40.0000 mg | DELAYED_RELEASE_TABLET | Freq: Two times a day (BID) | ORAL | 0 refills | Status: DC

## 2023-09-01 MED ORDER — LEVOTHYROXINE SODIUM 100 MCG PO TABS: 100.0000 ug | ORAL_TABLET | Freq: Every day | ORAL | 1 refills | Status: AC

## 2023-09-01 MED ORDER — SUCRALFATE 1 G PO TABS: 1.0000 g | ORAL_TABLET | Freq: Three times a day (TID) | ORAL | 0 refills | Status: DC

## 2023-09-01 NOTE — Patient Instructions (Addendum)
Please take pantoprazole twice daily, 30 min before breakfast and dinner. Take sucralfate four times daily - with meals and before bed.  We will contact you with results of your labs and h pylori test once available  Please resume taking your levothyroxine - take upon awakening, 30 min before the pantoprazole.  If your symptoms persist despite the treatment, please contact our office and we will order an ultrasound of your gallbladder

## 2023-09-01 NOTE — Progress Notes (Signed)
Established Patient Office Visit  Subjective:  Patient ID: Meghan Durham, female    DOB: [birth year]  Age: 63 y.o. MRN: [id number]  Chief Complaint  Patient presents with   Follow-up    Follow up on stomach issues. She wants to see if the stool kit would be better to do vs the breath test. Pt is fasting today. Pt will est with Dr Claiborne Billings in the future for physicals    Discussed the use of AI software for clinical note transcription with the patient who gave verbal consent to proceed.   History of Present Illness The patient is a 63 year old female who presents with abdominal pain and weight loss.  She experiences severe abdominal pain located in the upper abdomen, sometimes radiating upwards, particularly after drinking even a small amount of water. This pain has resulted in a ten-pound weight loss over the past month. She has been off proton pump inhibitors for over two weeks and last took famotidine about ten days ago. She is experiencing anxiety and feels 'jittery' and 'antsy' due to her discomfort and pain. She has also experienced a metallic taste in her mouth and denies any recent breakfast intake.  She has a history of taking clindamycin and a Z-Pak in December for a suspected sinus infection and dental issues, as well as a fungal pill for suspected thrush, all during a stressful period when her sister was hospitalized. This was when all of her GI issues started. No hx of this prior.  She has a history of thyroid issues and has been taking her thyroid medication regularly since December, although she was not consistent with it during that month. She mentions a recall of her thyroid medication from the manufacturer [employer] due to assay results that were atypical or out of specifications, which may have affected the dosage strength. She is concerned about the potential impact of this recall on her health.  Her mother is elderly and has had gallbladder issues.    Patient Active Problem  List   Diagnosis Date Noted   Disorder of thyroid gland 08/13/2023   Past Medical History:  Diagnosis Date   Thyroid disease    History reviewed. No pertinent surgical history. Social History   Tobacco Use   Smoking status: Never   Smokeless tobacco: Never      ROS: as noted in HPI  Objective:     BP 113/78   Pulse 76   Wt 171 lb (77.6 kg)   LMP 08/13/2011   SpO2 99%   BMI 26.00 kg/m  BP Readings from Last 3 Encounters:  09/01/23 113/78  08/14/23 113/78   Wt Readings from Last 3 Encounters:  09/01/23 171 lb (77.6 kg)  08/14/23 169 lb 12.8 oz (77 kg)      Physical Exam Vitals and nursing note reviewed.  Constitutional:      General: She is not in acute distress.    Appearance: Normal appearance. She is normal weight. She is not ill-appearing, toxic-appearing or diaphoretic.  HENT:     Head: Normocephalic and atraumatic.     Mouth/Throat:     Mouth: Mucous membranes are moist.     Pharynx: Oropharynx is clear. No oropharyngeal exudate or posterior oropharyngeal erythema.  Eyes:     General: No scleral icterus.       Right eye: No discharge.        Left eye: No discharge.     Extraocular Movements: Extraocular movements intact.  Pupils: Pupils are equal, round, and reactive to light.  Cardiovascular:     Rate and Rhythm: Normal rate and regular rhythm.     Pulses: Normal pulses.     Heart sounds: No murmur heard. Pulmonary:     Effort: Pulmonary effort is normal. No respiratory distress.     Breath sounds: Normal breath sounds.  Abdominal:     General: Abdomen is flat. Bowel sounds are normal. There is no distension.     Palpations: Abdomen is soft. There is no mass.     Tenderness: There is no abdominal tenderness (mild tenderness to deep palpation of the RUQ with a NEGATIVE murphy sign). There is no right CVA tenderness, left CVA tenderness, guarding or rebound.     Hernia: No hernia is present.     Comments: Negative hepatosplenomegaly Percussion  normal throughout  Musculoskeletal:     Cervical back: Normal range of motion.  Lymphadenopathy:     Cervical: No cervical adenopathy.  Skin:    General: Skin is warm and dry.     Coloration: Skin is not jaundiced.     Findings: No bruising, erythema or rash.  Neurological:     General: No focal deficit present.     Mental Status: She is alert and oriented to person, place, and time.     Cranial Nerves: No cranial nerve deficit.     Motor: No weakness.     Gait: Gait normal.      No results found for any visits on 09/01/23.  Last CBC No results found for: "WBC", "HGB", "HCT", "MCV", "MCH", "RDW", "PLT" Last metabolic panel No results found for: "GLUCOSE", "NA", "K", "CL", "CO2", "BUN", "CREATININE", "EGFR", "CALCIUM", "PHOS", "PROT", "ALBUMIN", "LABGLOB", "AGRATIO", "BILITOT", "ALKPHOS", "AST", "ALT", "ANIONGAP"    The ASCVD Risk score (Arnett DK, et al., 2019) failed to calculate for the following reasons:   Cannot find a previous HDL lab   Cannot find a previous total cholesterol lab  Assessment & Plan:  Hypothyroidism, unspecified type -     Levothyroxine Sodium; Take 1 tablet (100 mcg total) by mouth daily before breakfast.  Dispense: 90 tablet; Refill: 1 -     TSH Rfx on Abnormal to Free T4  Gastroesophageal reflux disease, unspecified whether esophagitis present -     H. pylori breath test -     Sucralfate; Take 1 tablet (1 g total) by mouth 4 (four) times daily -  with meals and at bedtime for 8 days.  Dispense: 32 tablet; Refill: 0 -     Pantoprazole Sodium; Take 1 tablet (40 mg total) by mouth 2 (two) times daily before a meal.  Dispense: 28 tablet; Refill: 0  Other fatigue -     B12 and Folate Panel -     TSH Rfx on Abnormal to Free T4 -     COMPLETE METABOLIC PANEL WITH GFR -     CBC  Altered taste  Epigastric pain -     H. pylori breath test -     Sucralfate; Take 1 tablet (1 g total) by mouth 4 (four) times daily -  with meals and at bedtime for 8 days.   Dispense: 32 tablet; Refill: 0 -     Pantoprazole Sodium; Take 1 tablet (40 mg total) by mouth 2 (two) times daily before a meal.  Dispense: 28 tablet; Refill: 0 -     COMPLETE METABOLIC PANEL WITH GFR -     CBC  Assessment and Plan Epigastric  Pain Severe, persistent pain with weight loss. Differential includes peptic ulcer disease, esophageal spasm, and gallbladder disease. Recent history of multiple antibiotic courses and stressors. -Order H. pylori breath test. -Start Pantoprazole twice daily and Sucralfate four times daily for suspected ulcer. -Consider Simethicone for bloating as needed. -Check CBC, CMP, TSH, and B12 levels. -consider RUQ [place] pending results and response to tx  Hypothyroidism On Levothyroxine daily. Recent recall of [employer] thyroid medications due to inconsistent dosing. -Refill Levothyroxine from a different manufacturer. -Check TSH level.  Anxiety Patient reports feeling "antsy" and anxious due to ongoing discomfort and pain. -Consider stress management techniques and follow-up for further evaluation if symptoms persist.   Return in about 1 year (around 08/31/2024) for Annual Physical.   Maretta Bees, PA

## 2023-09-02 ENCOUNTER — Encounter: Payer: Self-pay | Admitting: Urgent Care

## 2023-09-02 LAB — COMPLETE METABOLIC PANEL WITH GFR
AG Ratio: 1.9 (calc) (ref 1.0–2.5)
ALT: 13 U/L (ref 6–29)
AST: 15 U/L (ref 10–35)
Albumin: 4.9 g/dL (ref 3.6–5.1)
Alkaline phosphatase (APISO): 61 U/L (ref 37–153)
BUN: 14 mg/dL (ref 7–25)
CO2: 27 mmol/L (ref 20–32)
Calcium: 10.4 mg/dL (ref 8.6–10.4)
Chloride: 101 mmol/L (ref 98–110)
Creat: 0.92 mg/dL (ref 0.50–1.05)
Globulin: 2.6 g/dL (ref 1.9–3.7)
Glucose, Bld: 109 mg/dL — ABNORMAL HIGH (ref 65–99)
Potassium: 5.1 mmol/L (ref 3.5–5.3)
Sodium: 140 mmol/L (ref 135–146)
Total Bilirubin: 1.3 mg/dL — ABNORMAL HIGH (ref 0.2–1.2)
Total Protein: 7.5 g/dL (ref 6.1–8.1)
eGFR: 70 mL/min/{1.73_m2} (ref >=60)

## 2023-09-02 LAB — TSH RFX ON ABNORMAL TO FREE T4: TSH: 0.946 u[IU]/mL (ref 0.450–4.500)

## 2023-09-03 ENCOUNTER — Telehealth: Payer: No Typology Code available for payment source | Admitting: Urgent Care

## 2023-09-03 ENCOUNTER — Encounter: Payer: Self-pay | Admitting: Urgent Care

## 2023-09-03 VITALS — BP 113/78 | Wt 171.0 lb

## 2023-09-03 DIAGNOSIS — A048 Other specified bacterial intestinal infections: Secondary | ICD-10-CM

## 2023-09-03 LAB — H. PYLORI BREATH TEST: H. pylori Breath Test: DETECTED — AB

## 2023-09-03 MED ORDER — METRONIDAZOLE 250 MG PO TABS: 250.0000 mg | ORAL_TABLET | Freq: Four times a day (QID) | ORAL | 0 refills | Status: AC

## 2023-09-03 MED ORDER — TETRACYCLINE HCL 500 MG PO CAPS: 500.0000 mg | ORAL_CAPSULE | Freq: Four times a day (QID) | ORAL | 0 refills | Status: AC

## 2023-09-03 MED ORDER — BISMUTH SUBSALICYLATE 525 MG PO TABS: 1.0000 | ORAL_TABLET | Freq: Four times a day (QID) | ORAL | 0 refills | Status: DC

## 2023-09-03 NOTE — Patient Instructions (Addendum)
 You have h pylori. Please read the attached handout regarding this condition.  Please start taking the medications today: Tetracycline  500mg  four times daily. Do not take within 2 hours of eating dairy or milk containing foods (this will decrease absorption) Metronidazole  250mg  four times daily. Do not drink any alcohol while taking this medication. Bismuth  525mg  four times daily.  CONTINUE your pantoprazole  40mg  twice daily.  You can STOP your sucralfate  if you so desire.  Please continue taking your levothyroxine .  If you continue to have upper abdominal symptoms or changes in bowel movements despite treatment, please contact our office and we will obtain an ultrasound of you abdomen.  Please keep your appointment with GI on 09/15/23 should your symptoms fail to respond to treatment.

## 2023-09-03 NOTE — Progress Notes (Signed)
 Virtual telephone visit    Virtual Visit via Telephone Note      Patient location: Home. Patient and provider in visit Provider location: Office  I discussed the limitations of evaluation and management by telemedicine and the availability of in person appointments. The patient expressed understanding and agreed to proceed.   Visit Date: 09/03/2023  Today's healthcare provider: Benton LITTIE Gave, [place]     Subjective:    Patient ID: Meghan Durham, female    DOB: [birth year], 63 y.o.   MRN: [id number]  Chief Complaint  Patient presents with   Medication Problem    Discuss medications for H pylori    Pt requesting phone call today regarding extensive questions related to her recent positive H pylori test. She continues to experience GERD-like symptoms, decreased appetite and weight loss. She has been eating a bland diet. She mentions concerns regarding a recent change in stool color over the past few weeks. She continues to have abdominal discomfort, primarily in the epigastric region. She denies any new symptoms today. She was prescribed pantoprazole  two days ago, and sucralfate . Pt has reservations about taking these medications as not to overdose on meds. Recent labs showed T bili 1.3, otherwise all was normal.     Past Medical History:  Diagnosis Date   Thyroid  disease     History reviewed. No pertinent surgical history.  Family History  Problem Relation Age of Onset   Arthritis Mother    Arthritis Sister     Social History   Socioeconomic History   Marital status: Married    Spouse name: Not on file   Number of children: Not on file   Years of education: Not on file   Highest education level: Not on file  Occupational History   Not on file  Tobacco Use   Smoking status: Never   Smokeless tobacco: Never  Substance and Sexual Activity   Alcohol use: Not on file   Drug use: Not on file   Sexual activity: Not on file  Other Topics Concern   Not on file   Social History Narrative   Not on file   Social Drivers of Health   Financial Resource Strain: Not on file  Food Insecurity: Not on file  Transportation Needs: Not on file  Physical Activity: Not on file  Stress: Not on file  Social Connections: Not on file  Intimate Partner Violence: Not on file    Outpatient Medications Prior to Visit  Medication Sig Dispense Refill   betamethasone dipropionate 0.05 % cream      levothyroxine  (SYNTHROID ) 100 MCG tablet Take 1 tablet (100 mcg total) by mouth daily before breakfast. 90 tablet 1   pantoprazole  (PROTONIX ) 40 MG tablet Take 1 tablet (40 mg total) by mouth 2 (two) times daily before a meal. 28 tablet 0   Vitamin D, Ergocalciferol, (DRISDOL) 1.25 MG (50000 UNIT) CAPS capsule Take by mouth.     sucralfate  (CARAFATE ) 1 g tablet Take 1 tablet (1 g total) by mouth 4 (four) times daily -  with meals and at bedtime for 8 days. 32 tablet 0   No facility-administered medications prior to visit.    Allergies  Allergen Reactions   Penicillins Rash   Sulfa Antibiotics Rash    ROS As per HPI    Objective:    Physical Exam Due to nature of telephone visit, there was no visualization of patient. She was audibly heard via phone, completing sentences without shortness of breath  and did not appear to be in distress.   BP 113/78   Wt 171 lb (77.6 kg)   LMP 08/13/2011   BMI 26.00 kg/m  Wt Readings from Last 3 Encounters:  09/03/23 171 lb (77.6 kg)  09/01/23 171 lb (77.6 kg)  08/14/23 169 lb 12.8 oz (77 kg)        Assessment & Plan:   Problem List Items Addressed This Visit   None Visit Diagnoses       Helicobacter pylori infection    -  Primary   Relevant Medications   metroNIDAZOLE  (FLAGYL ) 250 MG tablet   tetracycline  (SUMYCIN ) 500 MG capsule   Bismuth  Subsalicylate 525 MG TABS       I have discontinued Olam GRADE. Lubinski's sucralfate . I am also having her start on metroNIDAZOLE , tetracycline , and Bismuth  Subsalicylate.  Additionally, I am having her maintain her betamethasone dipropionate, Vitamin D (Ergocalciferol), levothyroxine , and pantoprazole .  Meds ordered this encounter  Medications   metroNIDAZOLE  (FLAGYL ) 250 MG tablet    Sig: Take 1 tablet (250 mg total) by mouth 4 (four) times daily for 14 days.    Dispense:  56 tablet    Refill:  0   tetracycline  (SUMYCIN ) 500 MG capsule    Sig: Take 1 capsule (500 mg total) by mouth 4 (four) times daily for 14 days.    Dispense:  56 capsule    Refill:  0   Bismuth  Subsalicylate 525 MG TABS    Sig: Take 1 tablet by mouth in the morning, at noon, in the evening, and at bedtime.    Dispense:  56 tablet    Refill:  0     I discussed the assessment and treatment plan with the patient. The patient was provided an opportunity to ask questions and all were answered. The patient agreed with the plan and demonstrated an understanding of the instructions.   The patient was advised to call back or seek an in-person evaluation if the symptoms worsen or if the condition fails to improve as anticipated.  I provided 23 minutes of non-face-to-face time during this encounter.   Benton LITTIE Gave, PA [facility] [phone] (phone) [phone] (fax)  [facility]

## 2023-09-04 ENCOUNTER — Ambulatory Visit: Payer: Self-pay | Admitting: Urgent Care

## 2023-09-04 NOTE — Telephone Encounter (Signed)
**Note De-identified  [place] Obfuscation** Please advise 

## 2023-09-04 NOTE — Telephone Encounter (Signed)
  Chief Complaint: Sore tongue Symptoms: pain Frequency: began last night after second dose of medications Pertinent Negatives: Patient denies swelling, patches on tongue, open wounds, discoloration Disposition: [] ED /[] Urgent Care (no appt availability in office) / [] Appointment(In office/virtual)/ []  [place] [facility]/ [] Home Care/ [] Refused Recommended Disposition /[] [place] [facility]/ [x]  Follow-up with PCP Additional Notes: Patient calls for clarification on if she can take pepto tabs vs liquid. States she feels the liquid is making her tongue sore. This RN reviewed the med list and PCP orders with patient, advised that tablets were appropriate per note, patient requests that PCP review and verify. She states she is not sure she can take these antibiotics for 14 days. Alerting PCP for review and f/u.   Copied from CRM [id number]. Topic: Clinical - Medication Question >> Sep 04, 2023 10:52 AM Mercedes MATSU wrote: Reason for CRM: Patient called in to find out if she can take the tablets form instead of the liquid because the liquid is causing her tounge to be very sore. Patient is requesting a call back from a nurse. Reason for Disposition  [1] Caller has URGENT medicine question about med that PCP or specialist prescribed AND [2] triager unable to answer question  Answer Assessment - Initial Assessment Questions 1. SYMPTOM: What's the main symptom you're concerned about? (e.g., chapped lips, dry mouth, lump, sores)     Tongue is sore 2. ONSET: When did the  symptoms  start?     After second dose of abx last night 3. PAIN: Is there any pain? If Yes, ask: How bad is it? (Scale: 1-10; mild, moderate, severe)   - MILD (1-3):  doesn't interfere with eating or normal activities   - MODERATE (4-7): interferes with eating some solids and normal activities   - SEVERE (8-10):  excruciating pain, interferes with most normal activities   - SEVERE DYSPHAGIA: can't swallow liquids,  drooling     5/10 4. CAUSE: What do you think is causing the symptoms?     Antibiotics 5. OTHER SYMPTOMS: Do you have any other symptoms? (e.g., fever, sore throat, toothache, swelling)     Denies  Answer Assessment - Initial Assessment Questions 1. NAME of MEDICINE: What medicine(s) are you calling about?     Pepto bismol 2. QUESTION: What is your question? (e.g., double dose of medicine, side effect)     Patient states she was prescribed antibiotics and pepto bismol for H pylori- states she bought the liquid pepto bismol and feels that it is causing her tongue to be sore. She is asking if it is okay to take the tablets instead of the liquid.  3. PRESCRIBER: Who prescribed the medicine? Reason: if prescribed by specialist, call should be referred to that group.     PCP 4. SYMPTOMS: Do you have any symptoms? If Yes, ask: What symptoms are you having?  How bad are the symptoms (e.g., mild, moderate, severe)     Sore tongue- no white patches, no open sores, no swelling or discoloration  Protocols used: Mouth Symptoms-A-AH, Medication Question Call-A-AH

## 2023-09-04 NOTE — Telephone Encounter (Signed)
 Pt advised.

## 2023-09-04 NOTE — Telephone Encounter (Signed)
 I would recommend she take the tablets that were prescribed yesterday rather than the liquid. This would bypass her mouth. Yes, the antibiotics needs to be taken for 14 days.

## 2023-09-08 ENCOUNTER — Other Ambulatory Visit: Payer: Self-pay | Admitting: Urgent Care

## 2023-09-08 DIAGNOSIS — A048 Other specified bacterial intestinal infections: Secondary | ICD-10-CM

## 2023-09-08 MED ORDER — PANTOPRAZOLE SODIUM 20 MG PO TBEC: 20.0000 mg | DELAYED_RELEASE_TABLET | Freq: Two times a day (BID) | ORAL | 0 refills | Status: DC

## 2023-09-08 NOTE — Telephone Encounter (Signed)
 Pt  called with medication question regarding pantoprazole  (PROTONIX ) 40 MG tablet. Pt states she would like to get a prescription for 20 mg instead of 40. Pt is requesting a call back at [phone]                Please advise.

## 2023-09-08 NOTE — Telephone Encounter (Signed)
 Please notify pt I have called in the lower dose for her. Please also encourage her to keep her 09/15/23 office visit with GI. Thanks

## 2023-09-11 NOTE — Telephone Encounter (Signed)
I called in a sufficient amount of the pantoprazole to get her to the end of her treatment course for H pylori. I would recommend she discuss if the PPI needs to be continued with GI after her visit on 2/17.... they may determine that based upon EGD coupled with if her symptoms resolve.

## 2023-09-11 NOTE — Progress Notes (Signed)
Chief Complaint: Abdominal pain Primary GI Doctor: Dr. Adela Lank  HPI: Patient is a 63 year old female patient with past medical history of hypothyroidism, recently diagnosed H. Pylori, who was referred to me by Maretta Bees, PA for a complaint of abdominal pain.    09/01/2023 H. pylori breath test positive-patient treated with tetracycline, Flagyl, bismuth, and pantoprazole x 14 days.  Interval History    Patient presents for main complaint of gnawing epigastric discomfort, bloating, and heartburn that started last December. She reports she never had issues prior to this event. She was given two rounds of clindamycin and one zpack due to some dental issues she was having. Then she felt she had thrush so she took 1 antifungal pill on own.She has had some added stress with her mother's health as well. She reports her appetite has been poor and has lost about 10lbs. Intermittently burping.Her PCP ordered a H pylori breath test which was positive. She was given 14 days of tetracycline, Flagyl, bismuth, and pantoprazole. She has two more days left of the medication regimen. Patient is taking probiotic po daily which she states helps. She reports she has felt a little better but still has gnawing discomfort in her chest. The pantoprazole gives her a funny taste in her mouth and she states the color of her tongue has changed. ENT also saw her few weeks and and examined her mouth and per pt was told everything normal. She reports she has daily bowel movement but feels as if they are more fluffy since all the antibiotics. No black tarry stools or blood in stool. No NSAID use. No significant family history. Never had EGD or colonoscopy.  Wt Readings from Last 3 Encounters:  09/15/23 165 lb 2 oz (74.9 kg)  09/03/23 171 lb (77.6 kg)  09/01/23 171 lb (77.6 kg)    Past Medical History:  Diagnosis Date   GERD (gastroesophageal reflux disease)    Thyroid disease    Past Surgical History:  Procedure  Laterality Date   TONSILLECTOMY     Current Outpatient Medications  Medication Sig Dispense Refill   Bismuth Subsalicylate 525 MG TABS Take 1 tablet by mouth in the morning, at noon, in the evening, and at bedtime. 56 tablet 0   levothyroxine (SYNTHROID) 100 MCG tablet Take 1 tablet (100 mcg total) by mouth daily before breakfast. 90 tablet 1   metroNIDAZOLE (FLAGYL) 250 MG tablet Take 1 tablet (250 mg total) by mouth 4 (four) times daily for 14 days. 56 tablet 0   tetracycline (SUMYCIN) 500 MG capsule Take 1 capsule (500 mg total) by mouth 4 (four) times daily for 14 days. 56 capsule 0   Vitamin D, Ergocalciferol, (DRISDOL) 1.25 MG (50000 UNIT) CAPS capsule Take by mouth.     omeprazole (PRILOSEC) 20 MG capsule Take 1 capsule (20 mg total) by mouth 2 (two) times daily before a meal for 14 days, THEN 1 capsule (20 mg total) daily for 14 days. 42 capsule 0   No current facility-administered medications for this visit.    Allergies as of 09/15/2023 - Review Complete 09/15/2023  Allergen Reaction Noted   Penicillins Rash 01/07/2022   Sulfa antibiotics Rash 01/07/2022    Family History  Problem Relation Age of Onset   Arthritis Mother    Thyroid disease Mother    Arthritis Sister    Kidney Stones Sister     Review of Systems:    Constitutional: No weight loss, fever, chills, weakness or fatigue HEENT:  Eyes: No change in vision               Ears, Nose, Throat:  No change in hearing or congestion Skin: No rash or itching Cardiovascular: No chest pain, chest pressure or palpitations   Respiratory: No SOB or cough Gastrointestinal: See HPI and otherwise negative Genitourinary: No dysuria or change in urinary frequency Neurological: No headache, dizziness or syncope Musculoskeletal: No new muscle or joint pain Hematologic: No bleeding or bruising Psychiatric: No history of depression or anxiety    Physical Exam:  Vital signs: BP 110/70 (BP Location: Left Arm, Patient Position:  Sitting, Cuff Size: Normal)   Pulse 80   Ht 5\' 7"  (1.702 m) Comment: height measured without shoes  Wt 165 lb 2 oz (74.9 kg)   LMP 08/13/2011   BMI 25.86 kg/m   Constitutional:   Pleasant Caucasian female appears to be in NAD, Well developed, Well nourished, alert and cooperative Throat: Oral cavity and pharynx without inflammation, swelling or lesion.  Respiratory: Respirations even and unlabored. Lungs clear to auscultation bilaterally.   No wheezes, crackles, or rhonchi.  Cardiovascular: Normal S1, S2. Regular rate and rhythm. No peripheral edema, cyanosis or pallor.  Gastrointestinal:  Soft, nondistended, nontender. No rebound or guarding. Normal bowel sounds. No appreciable masses or hepatomegaly. Rectal:  Not performed.  Msk:  Symmetrical without gross deformities. Without edema, no deformity or joint abnormality.  Neurologic:  Alert and  oriented x4;  grossly normal neurologically.  Skin:   Dry and intact without significant lesions or rashes. Psychiatric: Oriented to person, place and time. Demonstrates good judgement and reason without abnormal affect or behaviors.  RELEVANT LABS AND IMAGING: CBC    Latest Ref Rng & Units 09/01/2023    9:31 AM  CBC  WBC 4.0 - 10.5 K/uL 7.5   Hemoglobin 12.0 - 15.0 g/dL 16.1   Hematocrit 09.6 - 46.0 % 45.9   Platelets 150.0 - 400.0 K/uL 285.0      CMP     Latest Ref Rng & Units 09/01/2023    9:31 AM  CMP  Glucose 65 - 99 mg/dL 045   BUN 7 - 25 mg/dL 14   Creatinine 4.09 - 1.05 mg/dL 8.11   Sodium 914 - 782 mmol/L 140   Potassium 3.5 - 5.3 mmol/L 5.1   Chloride 98 - 110 mmol/L 101   CO2 20 - 32 mmol/L 27   Calcium 8.6 - 10.4 mg/dL 95.6   Total Protein 6.1 - 8.1 g/dL 7.5   Total Bilirubin 0.2 - 1.2 mg/dL 1.3   AST 10 - 35 U/L 15   ALT 6 - 29 U/L 13     Lab Results  Component Value Date   TSH 0.946 09/01/2023  Cologuard 04/19/20 negative.  Assessment:     63 year old female patient who presents with acute symptoms of gnawing  epigastric pain, bloating, and heartburn after several rounds of antibiotics in December. H pylori test with PCP completed and positive. Patient has almost completed the full 14 day regimen. Due to the pantoprazole causing altered taste I will switch her to Omeprazole 20 mg twice daily for a few weeks and then titrate down to omeprazole 20 mg po daily along with GERD diet. I suspect she Tailor Westfall have some irritation due to the prior antibiotics. Will check for H pylori eradication test in 6 weeks. Patient asked if she really needed follow-up test and I explained the importance of follow-up testing. Recommended her husband be tested  as well. If her symptoms do not improve and the H pylori is negative, I recommended her next step be an endoscopic procedure. She would like to avoid invasive procedures at all cost due to her insurance plan. Will reevaluate at follow-up.  Plan: -Strict GERD diet, no late meals -Start Omeprazole 20 mg twice daily for 2 weeks, then go down to omeprazole 20 mg po daily in the am. -Discontinue pantoprazole -Avoid NSAID's. -Order follow-up eradication diatherix H pylori, if negative test and persistent symptoms will proceed with EGD. -Follow-up with me in 6 to 8 weeks  Thank you for the courtesy of this consult. Please call me with any questions or concerns.   Nonna Renninger, FNP-C [facility] 09/15/2023, 12:43 PM  Cc: Maretta Bees, PA

## 2023-09-12 NOTE — Telephone Encounter (Signed)
LVM to discuss

## 2023-09-15 ENCOUNTER — Ambulatory Visit: Payer: No Typology Code available for payment source | Admitting: Gastroenterology

## 2023-09-15 ENCOUNTER — Encounter: Payer: Self-pay | Admitting: Gastroenterology

## 2023-09-15 VITALS — BP 110/70 | HR 80 | Ht 67.0 in | Wt 165.1 lb

## 2023-09-15 DIAGNOSIS — B9681 Helicobacter pylori [H. pylori] as the cause of diseases classified elsewhere: Secondary | ICD-10-CM | POA: Diagnosis not present

## 2023-09-15 DIAGNOSIS — R142 Eructation: Secondary | ICD-10-CM

## 2023-09-15 DIAGNOSIS — R634 Abnormal weight loss: Secondary | ICD-10-CM

## 2023-09-15 DIAGNOSIS — R1013 Epigastric pain: Secondary | ICD-10-CM

## 2023-09-15 DIAGNOSIS — K219 Gastro-esophageal reflux disease without esophagitis: Secondary | ICD-10-CM

## 2023-09-15 DIAGNOSIS — R140 Abdominal distension (gaseous): Secondary | ICD-10-CM | POA: Diagnosis not present

## 2023-09-15 DIAGNOSIS — A048 Other specified bacterial intestinal infections: Secondary | ICD-10-CM

## 2023-09-15 DIAGNOSIS — R12 Heartburn: Secondary | ICD-10-CM | POA: Diagnosis not present

## 2023-09-15 MED ORDER — OMEPRAZOLE 20 MG PO CPDR: 20.0000 mg | DELAYED_RELEASE_CAPSULE | Freq: Two times a day (BID) | ORAL | 0 refills | Status: DC

## 2023-09-15 MED ORDER — OMEPRAZOLE 20 MG PO CPDR: DELAYED_RELEASE_CAPSULE | ORAL | 0 refills | Status: DC

## 2023-09-15 NOTE — Patient Instructions (Addendum)
Complete antibiotics.   We have sent the following medications to your pharmacy for you to pick up at your convenience: omeprazole 20 mg twice daily x 2 weeks, then reduce to 20 mg once daily x 2 weeks, then stop.   Please submit after 6 weeks off of antibiotics,  Your provider has ordered "Diatherix" stool testing for you. You have received a kit from our office today containing all necessary supplies to complete this test. Please carefully read the stool collection instructions provided in the kit before opening the accompanying materials. In addition, be sure there is a label providing your full name and date of birth on the "puritan opti-swab" tube that is supplied in the kit (if you do not see a label with this information on your test tube, please make [place] aware before test collection!). After completing the test, you should secure the purtian tube into the specimen biohazard bag. The [facility] E-Req sheet (including date and time of specimen collection) should be placed into the outside pocket of the specimen biohazard bag and returned to the [place] lab (basement floor of [facility]) within 3 days of collection. Please make sure to give the specimen to a staff member at the lab. DO NOT leave the specimen on the counter.   If the specimen date and time (can be found in the upper right boxed portion of the sheet) are not filled out on the E-Req sheet, the test will NOT be performed.   _______________________________________________________  If your blood pressure at your visit was 140/90 or greater, please contact your primary care physician to follow up on this.  _______________________________________________________  If you are age 63 or older, your body mass index should be between 23-30. Your Body mass index is 25.86 kg/m. If this is out of the aforementioned range listed, please consider follow up with your Primary Care Provider.  If you are age 75 or  younger, your body mass index should be between 19-25. Your Body mass index is 25.86 kg/m. If this is out of the aformentioned range listed, please consider follow up with your Primary Care Provider.   ________________________________________________________  The [facility] providers would like to encourage you to use [facility] to communicate with providers for non-urgent requests or questions.  Due to long hold times on the telephone, sending your provider a message by [facility] may be a faster and more efficient way to get a response.  Please allow 48 business hours for a response.  Please remember that this is for non-urgent requests.  _______________________________________________________

## 2023-09-15 NOTE — Progress Notes (Signed)
Agree with assessment plan as outlined. With new onset dyspepsia in the setting of H. pylori, at her age I think EGD would be reasonable to evaluate this now if she was willing.  I understand insurance coverage however.  We can continue with the plan as you outlined, it sounds like she is not interested in pursuing EGD due to the insurance issue.Meghan Durham  However if symptoms persist I strongly recommend EGD for this issue as well as screening colonoscopy.

## 2023-09-16 ENCOUNTER — Ambulatory Visit: Payer: Self-pay | Admitting: Urgent Care

## 2023-09-16 ENCOUNTER — Encounter: Payer: Self-pay | Admitting: Urgent Care

## 2023-09-16 DIAGNOSIS — B370 Candidal stomatitis: Secondary | ICD-10-CM

## 2023-09-16 MED ORDER — NYSTATIN 100000 UNIT/ML MT SUSP: 500000.0000 [IU] | Freq: Four times a day (QID) | OROMUCOSAL | 0 refills | Status: DC

## 2023-09-16 NOTE — Telephone Encounter (Signed)
Chief Complaint: Thrush Symptoms: Cotton feeling, no taste, white on tongue Frequency: Intermittent Pertinent Negatives: Patient denies fever, pain, difficulty breathing Disposition: [] ED /[] Urgent Care (no appt availability in office) / [x] Appointment(In office/virtual)/ []  [place] [facility]/ [] Home Care/ [] Refused Recommended Disposition /[] [place] [facility]/ []  Follow-up with PCP Additional Notes: Pt states she just stopped an abx and has thrush symptoms. Pt requesting a medication for thrush symptoms. **Pt states she does not want pills. She wants a mouthwash or liquid medication.** Pt states she has had this on and off for a month. Pt states she has tried gargling with saltwater. Pt wants PCP aware that she saw the GI doctor yesterday and is now taking omeprazole 20 bid. Pt denied scheduling an appt and would like a medication sent in for symptoms. Pt told PCP will be notified. This RN educated pt on home care, new-worsening symptoms, when to call back/seek emergent care. Pt verbalized understanding and agrees to plan.      Copied From CRM [id number]. Reason for Triage: Patient called to request a mouthwash or lozenge to help with thrush symptoms in mouth.   Patient has had her fill of pills and would like to try something liquid. Please advise and call patient back. She has other questions.   Pink Word: Allergic Reaction, possible.  Reason for Disposition  [1] White patches that stick to tongue or inner cheek AND [2] can be wiped off  Answer Assessment - Initial Assessment Questions 2. ONSET: "When did the  thrush  start?"     Month off and on 3. PAIN: "Is there any pain?" If Yes, ask: "How bad is it?" (Scale: 1-10; mild, moderate, severe)   - MILD (1-3):  doesn't interfere with eating or normal activities   - MODERATE (4-7): interferes with eating some solids and normal activities   - SEVERE (8-10):  excruciating pain, interferes with most normal activities   - SEVERE  DYSPHAGIA: can't swallow liquids, drooling     Denies 4. CAUSE: "What do you think is causing the symptoms?"     Recent antibiotics 5. OTHER SYMPTOMS: "Do you have any other symptoms?" (e.g., fever, sore throat, toothache, swelling)     Loss of tate, cotton feeling  Protocols used: Mouth Symptoms-A-AH

## 2023-09-16 NOTE — Telephone Encounter (Signed)
Please notify pt that nystatin swish and spit has been called in for her.

## 2023-09-18 ENCOUNTER — Encounter: Payer: Self-pay | Admitting: Urgent Care

## 2023-09-18 ENCOUNTER — Ambulatory Visit: Payer: No Typology Code available for payment source | Admitting: Urgent Care

## 2023-09-18 VITALS — BP 111/76 | HR 83 | Temp 97.8°F | Wt 164.8 lb

## 2023-09-18 DIAGNOSIS — K143 Hypertrophy of tongue papillae: Secondary | ICD-10-CM

## 2023-09-18 DIAGNOSIS — B370 Candidal stomatitis: Secondary | ICD-10-CM | POA: Diagnosis not present

## 2023-09-18 NOTE — Patient Instructions (Signed)
Please swish 5mL of the nystatin 4 time daily for a minimum of 30 seconds each time for 7-14 days. You can use the pepto-bismol on your tongue if you felt this helped tin the past.  Please send me a photo through mychart in 7-10 days to monitor your progress.

## 2023-09-18 NOTE — Progress Notes (Signed)
Established Patient Office Visit  Subjective:  Patient ID: Meghan Durham, female    DOB: [birth year]  Age: 63 y.o. MRN: [id number]  Chief Complaint  Patient presents with   Thrush    Pt states she just took the meds yesterday for thrush but hasn't taken it since. She has been using salt water and baking soda.     HPI  Discussed the use of AI scribe software for clinical note transcription with the patient, who gave verbal consent to proceed.  History of Present Illness   Meghan Durham is a 63 year old female who presents with concerns of oral thrush and gastrointestinal symptoms.  She has been experiencing oral thrush for several months, characterized by a persistent white coating on her tongue and throat that cannot be scraped off. She also has a metallic taste, dry mouth, and difficulty tasting food. She has tried nystatin x 1 dose, which caused a burning sensation, and natural remedies like saltwater rinses and baking soda. Pepto Bismol temporarily cleared her tongue.  She recently completed a course of antibiotics for H pylori. She was advised to start Protonix at 20 mg twice daily for two weeks, then reduce to once daily. An EGD was suggested if symptoms persist, but it has not been scheduled due to insurance concerns and current symptom management. She associates stress and antibiotic use with her symptoms. No current reflux symptoms on her medication, but she occasionally experiences a foul odor or taste in her mouth.  She denies having dentures but has an implant and a crown, which she associates with the onset of her symptoms. She has had a persistent white coating on her tongue since August, following a course of steroids for poison ivy, which she suspects may have contributed to her condition.        Patient Active Problem List   Diagnosis Date Noted   Disorder of thyroid gland 08/13/2023   Past Medical History:  Diagnosis Date   GERD (gastroesophageal reflux disease)     Thyroid disease    Past Surgical History:  Procedure Laterality Date   TONSILLECTOMY     Social History   Tobacco Use   Smoking status: Never   Smokeless tobacco: Never  Vaping Use   Vaping status: Never Used  Substance Use Topics   Alcohol use: Not Currently   Drug use: Never      ROS: as noted in HPI  Objective:     BP 111/76   Pulse 83   Temp 97.8 F (36.6 C) (Oral)   Wt 164 lb 12.8 oz (74.8 kg)   LMP 08/13/2011   SpO2 99%   BMI 25.81 kg/m  BP Readings from Last 3 Encounters:  09/18/23 111/76  09/15/23 110/70  09/03/23 113/78   Wt Readings from Last 3 Encounters:  09/18/23 164 lb 12.8 oz (74.8 kg)  09/15/23 165 lb 2 oz (74.9 kg)  09/03/23 171 lb (77.6 kg)      Physical Exam Vitals and nursing note reviewed.  Constitutional:      General: She is not in acute distress.    Appearance: Normal appearance. She is normal weight. She is not ill-appearing, toxic-appearing or diaphoretic.  HENT:     Head: Normocephalic and atraumatic.     Right Ear: External ear normal.     Left Ear: External ear normal.     Nose: Nose normal.     Mouth/Throat:     Lips: Pink.     Mouth:  Mucous membranes are moist. No oral lesions.     Tongue: No lesions.     Pharynx: Oropharynx is clear. Uvula midline. No pharyngeal swelling, oropharyngeal exudate, posterior oropharyngeal erythema, uvula swelling or postnasal drip.      Comments: Pt c/o dry mouth however moist mucous membranes noted upon exam Cardiovascular:     Rate and Rhythm: Normal rate.  Pulmonary:     Effort: Pulmonary effort is normal. No respiratory distress.  Musculoskeletal:     Cervical back: Normal range of motion and neck supple. No rigidity or tenderness.  Lymphadenopathy:     Cervical: No cervical adenopathy.  Skin:    General: Skin is warm.     Findings: No erythema or rash.  Neurological:     General: No focal deficit present.     Mental Status: She is alert and oriented to person, place, and  time.      No results found for any visits on 09/18/23.  Last CBC Lab Results  Component Value Date   WBC 7.5 09/01/2023   HGB 15.0 09/01/2023   HCT 45.9 09/01/2023   MCV 91.5 09/01/2023   RDW 13.3 09/01/2023   PLT 285.0 09/01/2023   Last metabolic panel Lab Results  Component Value Date   GLUCOSE 109 (H) 09/01/2023   NA 140 09/01/2023   K 5.1 09/01/2023   CL 101 09/01/2023   CO2 27 09/01/2023   BUN 14 09/01/2023   CREATININE 0.92 09/01/2023   EGFR 70 09/01/2023   CALCIUM 10.4 09/01/2023   PROT 7.5 09/01/2023   BILITOT 1.3 (H) 09/01/2023   AST 15 09/01/2023   ALT 13 09/01/2023      The ASCVD Risk score (Arnett DK, et al., 2019) failed to calculate for the following reasons:   Cannot find a previous HDL lab   Cannot find a previous total cholesterol lab  Assessment & Plan:  Thrush  Tongue coating  Assessment and Plan    Oral Thrush White coating on tongue, burning sensation, and c/o dry mouth. Possible thrush secondary to recent antibiotic and steroid use. Nystatin used once with burning sensation reported. Discussed the importance of consistent use of Nystatin and potential alternative treatments. This does NOT have clinical appearance of leukoplakia or lichen planus, although will keep in the differentials if it does not respond to antifungals. -Continue Nystatin swish and spit four times daily for 7-14 days. Gave pt the option to switch to clotrimazole troche if desired as an alternative. -Consider use of pepto-bismol as an alternative treatment as pt has previously found this to be helpful. -Check in via MyChart in one week with progress, potentially including a photo for assessment.  Gastroesophageal Reflux Disease (GERD) Completed antibiotics and currently on Protonix. Discussed potential need for EGD to evaluate condition further. -Continue Protonix 20mg  daily for two weeks, then reduce to 20mg  once daily. -Consider EGD in the future if symptoms persist  or worsen- pt to discuss this with GI.          No follow-ups on file.   Maretta Bees, PA

## 2023-09-19 ENCOUNTER — Telehealth: Payer: Self-pay | Admitting: Gastroenterology

## 2023-09-19 NOTE — Telephone Encounter (Signed)
Spoke to patient who states that she would like to just take pantoprazole twice daily and titrate it down to once daily over several weeks vs switching to omeprazole and titrating down. She states that she thinks she has thrush that was causing the altered taste she previously complained of (no longer thinks this is related to the pantoprazole). Patient says she is on nystatin mouthwash as prescribed by [facility] since 2/18 and has been swishing and spitting but will begin to swish and swallow instead.   Patient also notes that she has diarrhea following H Pylori antibiotic regimen. States that it is not as bad as it was but is still noticeable. We discussed that antibiotics can cause altered bowel habits. I asked her to give it a few more days to see if diarrhea continues to improve. If she notices worsening or continued diarrhea, I have asked that she make [place] aware as we may need to further evaluate with stool studies etc. She verbalizes understanding of this information.  Deanna, May I have patient take pantoprazole 20 mg BID with taper to once daily in place of previously recommended omeprazole?

## 2023-09-19 NOTE — Telephone Encounter (Signed)
Patient called and stated that she is wanting to discuss her medication regarding her pantoprazole and the Omeprazole. Patient is wanting to know that difference. Patient is requesting a call back. Please advise.

## 2023-09-22 NOTE — Telephone Encounter (Signed)
 Contacted patient to advise that Meghan Durham may continue pantoprazole 20 mg twice daily with taper to once daily dosing instead of changing to omeprazole as per her request. Following this advice, patient begins to explain that Meghan Durham is having continuing reflux despite pantoprazole twice daily. Is having chest burning with things like plain tuna sandwich and even water. We discussed that in light of this information, it may be a good idea to move forward with the switch to omeprazole 20 mg twice daily to see if this calms her symptoms rather than continuing to take and taper pantoprazole that is already not controlling symptoms. Meghan Durham agrees with making the change originally recommended to her by [facility].

## 2023-09-25 NOTE — Telephone Encounter (Signed)
 Spoke to patient who states that she has been on omeprazole x 2 days now and did feel "a little bit better' yesterday. Patient goes on to state that she wants to go ahead and taper down to once daily dosing and d/c as quickly as possible. Sites concerns that she is on "such high doses" of PPI and does not have any stomach acid. She then questions whether she may take pepcid in the mornings while taking omeprazole in the evenings. I advised this is fine. Patient says that she feels her symptoms are related to her LES after reading about it.

## 2023-09-25 NOTE — Telephone Encounter (Signed)
 Inbound call from patient, would like to discuss other medication instead of tapering pantoprazole.

## 2023-10-10 ENCOUNTER — Telehealth: Payer: Self-pay

## 2023-10-10 NOTE — Telephone Encounter (Signed)
 Great thanks. We can discuss this then. Please have her come fasting

## 2023-10-10 NOTE — Telephone Encounter (Signed)
 FYI Pt scheduled for 03/17

## 2023-10-10 NOTE — Telephone Encounter (Signed)
 Copied from CRM [id number]. Topic: General - Other >> Oct 10, 2023  9:04 AM Meghan Durham wrote: Reason for CRM: patient would like her labs checked for b12 and kidney function and thyroid and gallbladder

## 2023-10-13 ENCOUNTER — Ambulatory Visit: Admitting: Urgent Care

## 2023-10-13 ENCOUNTER — Encounter: Payer: Self-pay | Admitting: Urgent Care

## 2023-10-13 VITALS — BP 112/78 | HR 72 | Wt 162.0 lb

## 2023-10-13 DIAGNOSIS — G6289 Other specified polyneuropathies: Secondary | ICD-10-CM | POA: Diagnosis not present

## 2023-10-13 DIAGNOSIS — Z79899 Other long term (current) drug therapy: Secondary | ICD-10-CM

## 2023-10-13 DIAGNOSIS — R7989 Other specified abnormal findings of blood chemistry: Secondary | ICD-10-CM | POA: Diagnosis not present

## 2023-10-13 DIAGNOSIS — R195 Other fecal abnormalities: Secondary | ICD-10-CM | POA: Diagnosis not present

## 2023-10-13 LAB — POCT URINALYSIS DIPSTICK
Bilirubin, UA: NEGATIVE
Blood, UA: NEGATIVE
Glucose, UA: NEGATIVE
Ketones, UA: NEGATIVE
Leukocytes, UA: NEGATIVE
Nitrite, UA: NEGATIVE
Protein, UA: NEGATIVE
Spec Grav, UA: <=1.005 — AB (ref 1.010–1.025)
Urobilinogen, UA: 0.2 U/dL
pH, UA: 6 (ref 5.0–8.0)

## 2023-10-13 LAB — COMPREHENSIVE METABOLIC PANEL
ALT: 13 U/L (ref 0–35)
AST: 16 U/L (ref 0–37)
Albumin: 4.8 g/dL (ref 3.5–5.2)
Alkaline Phosphatase: 71 U/L (ref 39–117)
BUN: 13 mg/dL (ref 6–23)
CO2: 29 meq/L (ref 19–32)
Calcium: 10.2 mg/dL (ref 8.4–10.5)
Chloride: 101 meq/L (ref 96–112)
Creatinine, Ser: 0.98 mg/dL (ref 0.40–1.20)
GFR: 61.78 mL/min (ref >60.00)
Glucose, Bld: 99 mg/dL (ref 70–99)
Potassium: 4.4 meq/L (ref 3.5–5.1)
Sodium: 139 meq/L (ref 135–145)
Total Bilirubin: 1.1 mg/dL (ref 0.2–1.2)
Total Protein: 7.3 g/dL (ref 6.0–8.3)

## 2023-10-13 LAB — B12 AND FOLATE PANEL
Folate: >25.2 ng/mL (ref >5.9)
Vitamin B-12: 364 pg/mL (ref 211–911)

## 2023-10-13 LAB — MAGNESIUM: Magnesium: 2.2 mg/dL (ref 1.5–2.5)

## 2023-10-13 NOTE — Progress Notes (Signed)
 Established Patient Office Visit  Subjective:  Patient ID: Meghan Durham, female    DOB: 23-Dec-1960  Age: 63 y.o. MRN: [id number]  Chief Complaint  Patient presents with   Diarrhea    Pt has been having diarrhea on and off since Jan and wants to make sure she doesn't have any other issue. She is requesting labs.     HPI  63 year old female who presents with persistent gastrointestinal symptoms.  She has been experiencing persistent soft stools occurring two to three times in the morning for approximately six weeks. The stools are described as soft but formed, sometimes disintegrating upon flushing. She just completed use of doxycycline and Flagyl for H. pylori treatment (treatment completed roughly one month ago).  She has a history of H. pylori infection, for which she completed a course of quadruple therapy including doxycycline and Flagyl about a month ago. Since the treatment, she no longer experiences burping but continues to have soft stools that she feels are discolored and occasional burning in the chest and stomach, particularly at night. She has been tapering off proton pump inhibitors (PPIs) and is currently using Pepcid 20 mg every other night, which she takes around 5 PM to manage nighttime symptoms.  She describes a tapering regimen from PPIs, starting with 80 mg for two weeks, then reducing to 40 mg, and subsequently to 20 mg, before switching to Pepcid. She experiences burning sensations if she skips doses, which she manages by sitting up or using Pepcid. She wants to discontinue PPIs due to concerns about long-term use and side effects, including headaches. She reports tingling in her legs, which she associates with potential B12 deficiency from long-term PPI use. Her previous B12 level was noted to be low at 336. She also mentions a concern about the color of her stools and a previous bilirubin level that was slightly elevated (1.3).  No current urinary symptoms but she  requests a urine test for reassurance. She is concerned about potential pancreatic issues but does not report symptoms typical of exocrine pancreatic insufficiency, such as steatorrhea.      Patient Active Problem List   Diagnosis Date Noted   Disorder of thyroid gland 08/13/2023   Past Medical History:  Diagnosis Date   GERD (gastroesophageal reflux disease)    Thyroid disease    Past Surgical History:  Procedure Laterality Date   TONSILLECTOMY     Social History   Tobacco Use   Smoking status: Never   Smokeless tobacco: Never  Vaping Use   Vaping status: Never Used  Substance Use Topics   Alcohol use: Not Currently   Drug use: Never      ROS: as noted in HPI  Objective:     BP 112/78   Pulse 72   Wt 162 lb (73.5 kg)   LMP 08/13/2011   SpO2 100%   BMI 25.37 kg/m  BP Readings from Last 3 Encounters:  10/13/23 112/78  09/18/23 111/76  09/15/23 110/70   Wt Readings from Last 3 Encounters:  10/13/23 162 lb (73.5 kg)  09/18/23 164 lb 12.8 oz (74.8 kg)  09/15/23 165 lb 2 oz (74.9 kg)      Physical Exam Vitals and nursing note reviewed.  Constitutional:      General: She is not in acute distress.    Appearance: Normal appearance. She is normal weight. She is not ill-appearing, toxic-appearing or diaphoretic.  HENT:     Head: Normocephalic and atraumatic.  Mouth/Throat:     Mouth: Mucous membranes are moist.  Eyes:     General: No scleral icterus.       Right eye: No discharge.        Left eye: No discharge.  Cardiovascular:     Rate and Rhythm: Normal rate and regular rhythm.  Pulmonary:     Effort: Pulmonary effort is normal. No respiratory distress.  Abdominal:     General: Abdomen is flat. Bowel sounds are normal. There is no distension.     Palpations: Abdomen is soft. There is no mass.     Tenderness: There is no abdominal tenderness. There is no right CVA tenderness, left CVA tenderness, guarding or rebound.     Hernia: No hernia is  present.  Skin:    General: Skin is warm and dry.     Coloration: Skin is not jaundiced.     Findings: No bruising, erythema or rash.  Neurological:     General: No focal deficit present.     Mental Status: She is alert and oriented to person, place, and time.     Gait: Gait normal.  Psychiatric:        Mood and Affect: Mood normal.        Behavior: Behavior normal.      Results for orders placed or performed in visit on 10/13/23  POCT urinalysis dipstick  Result Value Ref Range   Color, UA yellow    Clarity, UA clear    Glucose, UA Negative Negative   Bilirubin, UA negative    Ketones, UA negative    Spec Grav, UA <=1.005 (A) 1.010 - 1.025   Blood, UA negative    pH, UA 6.0 5.0 - 8.0   Protein, UA Negative Negative   Urobilinogen, UA 0.2 0.2 or 1.0 E.U./dL   Nitrite, UA negative    Leukocytes, UA Negative Negative   Appearance     Odor      Last CBC Lab Results  Component Value Date   WBC 7.5 09/01/2023   HGB 15.0 09/01/2023   HCT 45.9 09/01/2023   MCV 91.5 09/01/2023   RDW 13.3 09/01/2023   PLT 285.0 09/01/2023   Last metabolic panel Lab Results  Component Value Date   GLUCOSE 109 (H) 09/01/2023   NA 140 09/01/2023   K 5.1 09/01/2023   CL 101 09/01/2023   CO2 27 09/01/2023   BUN 14 09/01/2023   CREATININE 0.92 09/01/2023   EGFR 70 09/01/2023   CALCIUM 10.4 09/01/2023   PROT 7.5 09/01/2023   BILITOT 1.3 (H) 09/01/2023   AST 15 09/01/2023   ALT 13 09/01/2023      The ASCVD Risk score (Arnett DK, et al., 2019) failed to calculate for the following reasons:   Cannot find a previous HDL lab   Cannot find a previous total cholesterol lab  Assessment & Plan:  Abnormal stool color -     Comprehensive metabolic panel -     B12 and Folate Panel -     POCT urinalysis dipstick  Abnormal bilirubin test -     B12 and Folate Panel -     POCT urinalysis dipstick  Long-term current use of proton pump inhibitor therapy  Other polyneuropathy -      Comprehensive metabolic panel -     B12 and Folate Panel -     Magnesium  Assessment and Plan    Soft, frequent bowel movements Recent doxycycline and Flagyl use for H pylori,  now completed one month ago. Pt showed a picture of her stool- it was a greenish coloration and soft, but formed. The stool appeared formed ([place] stool form between type 4/5). Suspect soft BM may be secondary to H2 blocker - Order liver function test to assess bilirubin levels. - Consider stool culture and sensitivity if blood work is normal. - Consider O&P test if symptoms persist. - consider stool pancreatic elastase test - discussed H pylori testing as "test of cure"; EGD with bx best way to complete this  Gastritis Persistent symptoms despite H. pylori treatment. Acid suppression necessary to prevent complications. Risks of untreated gastritis include esophageal changes discussed. Benefits of acid suppression outweigh risks. - Continue Pepcid 20 mg daily. (I encouraged pt to stay on PPI but she does not want to do this) - Consider EGD if symptoms persist or worsen. Pt has follow up in May with GI - Monitor symptoms and adjust Pepcid dosage as needed.  Peripheral neuropathy Previous B12 level was 336. Tingling in legs possibly related to deficiency. Long-term PPI use can contribute to deficiency. - Order B12 and folate level test. - Monitor B12 levels and consider supplementation if levels are low.          No follow-ups on file.   Maretta Bees, PA

## 2023-10-13 NOTE — Patient Instructions (Signed)
 We tested your labs and urine today. Results will be available on Mychart.  Please continue taking your pepcid 20mg  daily (best taken before bed) for 6-8 weeks. Please elevate the head of your bed to 30 degrees at night to prevent reflux symptoms.  If your symptoms persist despite these measures, please schedule a follow up with GI to consider EGD as this will give definitive answers to your symptoms.

## 2023-10-14 ENCOUNTER — Encounter: Payer: Self-pay | Admitting: Urgent Care

## 2023-12-15 ENCOUNTER — Ambulatory Visit: Payer: No Typology Code available for payment source | Admitting: Gastroenterology
